# Patient Record
Sex: Male | Born: 1990 | Race: Black or African American | Hispanic: No | Marital: Married | Smoking: Former smoker
Health system: Southern US, Community
[De-identification: ages and names within clinical notes are randomized; demographics above are authoritative.]

## PROBLEM LIST (undated history)

## (undated) DIAGNOSIS — J45909 Unspecified asthma, uncomplicated: Secondary | ICD-10-CM

---

## 2013-05-09 ENCOUNTER — Emergency Department (HOSPITAL_COMMUNITY): Payer: BC Managed Care – PPO

## 2013-05-09 ENCOUNTER — Emergency Department (HOSPITAL_COMMUNITY)
Admission: EM | Admit: 2013-05-09 | Discharge: 2013-05-09 | Disposition: A | Discharge status: Home / Self Care | Payer: BC Managed Care – PPO | Attending: Emergency Medicine | Admitting: Emergency Medicine

## 2013-05-09 ENCOUNTER — Encounter (HOSPITAL_COMMUNITY): Payer: Self-pay | Admitting: Adult Health

## 2013-05-09 DIAGNOSIS — F17200 Nicotine dependence, unspecified, uncomplicated: Secondary | ICD-10-CM | POA: Insufficient documentation

## 2013-05-09 DIAGNOSIS — J45901 Unspecified asthma with (acute) exacerbation: Secondary | ICD-10-CM | POA: Insufficient documentation

## 2013-05-09 DIAGNOSIS — M549 Dorsalgia, unspecified: Secondary | ICD-10-CM | POA: Insufficient documentation

## 2013-05-09 HISTORY — DX: Unspecified asthma, uncomplicated: J45.909

## 2013-05-09 MED ORDER — ALBUTEROL SULFATE HFA 108 (90 BASE) MCG/ACT IN AERS: 1.0000 | INHALATION_SPRAY | Freq: Four times a day (QID) | RESPIRATORY_TRACT | Status: DC | PRN

## 2013-05-09 MED ORDER — PREDNISONE 20 MG PO TABS: 40.0000 mg | ORAL_TABLET | Freq: Every day | ORAL | Status: DC

## 2013-05-09 MED ORDER — IPRATROPIUM BROMIDE 0.02 % IN SOLN
0.5000 mg | RESPIRATORY_TRACT | Status: DC
  Administered 2013-05-09: 0.5 mg via RESPIRATORY_TRACT

## 2013-05-09 MED ORDER — ALBUTEROL SULFATE (5 MG/ML) 0.5% IN NEBU
2.5000 mg | INHALATION_SOLUTION | RESPIRATORY_TRACT | Status: DC
  Administered 2013-05-09: 5 mg via RESPIRATORY_TRACT

## 2013-05-09 MED ORDER — ALBUTEROL SULFATE (5 MG/ML) 0.5% IN NEBU
INHALATION_SOLUTION | RESPIRATORY_TRACT | Status: AC
  Administered 2013-05-09: 5 mg via RESPIRATORY_TRACT
  Filled 2013-05-09: qty 1

## 2013-05-09 MED ORDER — IPRATROPIUM BROMIDE 0.02 % IN SOLN
RESPIRATORY_TRACT | Status: AC
  Filled 2013-05-09: qty 2.5

## 2013-05-09 MED ORDER — ALBUTEROL SULFATE HFA 108 (90 BASE) MCG/ACT IN AERS
2.0000 | INHALATION_SPRAY | Freq: Once | RESPIRATORY_TRACT | Status: AC
  Administered 2013-05-09: 2 via RESPIRATORY_TRACT
  Filled 2013-05-09: qty 6.7

## 2013-05-09 NOTE — ED Provider Notes (Signed)
CSN: [id number]     Arrival date & time 05/09/13  2042 History  This chart was scribed for non-physician practitioner Jaynie Crumble, PA-C working with Flint Melter, MD by Valera Castle, ED scribe. This patient was seen in room TR08C/TR08C and the patient's care was started at 9:00 PM.    Chief Complaint  Patient presents with  . Asthma   The history is provided by the patient. No language interpreter was used.   HPI Comments: Adam Wallace is a 22 y.o. male with a h/o asthma who presents to the Emergency Department complaining of asthma exacerbation onset last night. He experienced gradually worsening SOB onset last night, and states that he woke up with congestion this morning. He also c/o of associated wheezing onset this morning. He states associated pain in his back/left ribs. He reports having a cough early in the week, but that it has recently subsided. He states he is out of asthma medication. He reports that he cannot remember the last asthma incident. He states he smokes 2-3 times a week, but denies drinking EtOH. He denies having a fever.    Past Medical History  Diagnosis Date  . Asthma    History reviewed. No pertinent past surgical history. History reviewed. No pertinent family history. History  Substance Use Topics  . Smoking status: Current Every Day Smoker    Types: Cigars  . Smokeless tobacco: Not on file  . Alcohol Use: No    Review of Systems  Constitutional: Negative for fever.  HENT: Positive for congestion.   Respiratory: Positive for cough, shortness of breath and wheezing.   Musculoskeletal: Positive for back pain.  All other systems reviewed and are negative.    Allergies  Review of patient's allergies indicates no known allergies.  Home Medications  No current outpatient prescriptions on file.  Triage Vitals: BP 146/82  Pulse 100  Temp(Src) 98.1 F (36.7 C)  Resp 18  SpO2 99%  Physical Exam  Nursing note and vitals  reviewed. Constitutional: He is oriented to person, place, and time. He appears well-developed and well-nourished.  HENT:  Head: Normocephalic and atraumatic.  Eyes: EOM are normal.  Neck: Normal range of motion. Neck supple.  Cardiovascular: Normal rate, regular rhythm and normal heart sounds.   Pulmonary/Chest: Effort normal. He has wheezes.  Inspiratory and expiratory wheezing in all lung fields.   Musculoskeletal: Normal range of motion.  Neurological: He is alert and oriented to person, place, and time.  Skin: Skin is warm and dry.  Psychiatric: He has a normal mood and affect. His behavior is normal.    ED Course  Procedures (including critical care time)  DIAGNOSTIC STUDIES: Oxygen Saturation is 99% on room air, normal by my interpretation.    COORDINATION OF CARE: 9:31 PM-Discussed treatment plan which includes CXR with pt at bedside and pt agreed to plan.   9:32 PM Initial wheezing has resolved after pt received a breathing treatment.   Medications  ipratropium (ATROVENT) nebulizer solution 0.5 mg ( Nebulization Not Given 05/09/13 2140)    And  albuterol (PROVENTIL) (5 MG/ML) 0.5% nebulizer solution 2.5 mg (5 mg Nebulization Given 05/09/13 2057)     Labs Review Labs Reviewed - No data to display Imaging Review Dg Chest 2 View  05/09/2013   *RADIOLOGY REPORT*  Clinical Data: Asthma  CHEST - 2 VIEW  Comparison: None.  Findings: Cardiac and mediastinal silhouettes are within normal limits.  Lungs are mildly hyperinflated.  There is mild central peribronchial thickening,  which may be related to reactive airways disease.  No focal airspace consolidation identified.  There is no pulmonary edema or pleural effusion.  No pneumothorax.  No acute osseous abnormality.  IMPRESSION: Mild hyperinflation with central peribronchial thickening, which may be related to asthma.  No focal infiltrates identified.   Original Report Authenticated By: Rise Mu, M.D.    MDM   1.  Asthma exacerbation      Patient with asthma exacerbation from home. He reports also pain in the left lower ribs. Patient ran out of his albuterol at home. On initial presentation his vital signs are normal however inspiratory and expiratory wheezes heard in all lung fields. Patient was giving albuterol and Atrovent nebulizer treatments which helped his breathing. Patient was reassessed and he is no longer wheezing moving air normally. Patient states he feels much better with one nap and does not want anymore treatments. I did get a chest x-ray due to his pain, shortness of breath to rule out pneumothorax versus pneumonia. His chest x-ray is negative. Patient will be discharged home with inhaler and prednisone short course for inflammation. Patient is to follow up with his primary care Dr.  Ceasar Mons Vitals:   05/09/13 2050  BP: 146/82  Pulse: 100  Temp: 98.1 F (36.7 C)  Resp: 18  SpO2: 99%   I personally performed the services described in this documentation, which was scribed in my presence. The recorded information has been reviewed and is accurate.    Lottie Mussel, PA-C 05/09/13 2345

## 2013-05-09 NOTE — ED Notes (Signed)
Presents with bilateral inspiratory and expiratory wheezes, able to speak in full sentences, SAts 99% RA. Began last night. Out of asthma medications.

## 2013-05-09 NOTE — ED Notes (Signed)
Provider in room with pt.

## 2013-05-10 NOTE — ED Provider Notes (Signed)
Medical screening examination/treatment/procedure(s) were performed by non-physician practitioner and as supervising physician I was immediately available for consultation/collaboration.  Reena Borromeo L Talitha Dicarlo, MD 05/10/13 0036 

## 2014-06-09 ENCOUNTER — Emergency Department (HOSPITAL_COMMUNITY)
Admission: EM | Admit: 2014-06-09 | Discharge: 2014-06-09 | Disposition: A | Discharge status: Home / Self Care | Payer: BC Managed Care – PPO | Attending: Emergency Medicine | Admitting: Emergency Medicine

## 2014-06-09 ENCOUNTER — Encounter (HOSPITAL_COMMUNITY): Payer: Self-pay | Admitting: Emergency Medicine

## 2014-06-09 DIAGNOSIS — J45901 Unspecified asthma with (acute) exacerbation: Secondary | ICD-10-CM | POA: Insufficient documentation

## 2014-06-09 DIAGNOSIS — Z79899 Other long term (current) drug therapy: Secondary | ICD-10-CM | POA: Diagnosis not present

## 2014-06-09 DIAGNOSIS — Z720 Tobacco use: Secondary | ICD-10-CM | POA: Insufficient documentation

## 2014-06-09 DIAGNOSIS — R0602 Shortness of breath: Secondary | ICD-10-CM | POA: Diagnosis present

## 2014-06-09 MED ORDER — ALBUTEROL SULFATE HFA 108 (90 BASE) MCG/ACT IN AERS: 1.0000 | INHALATION_SPRAY | Freq: Four times a day (QID) | RESPIRATORY_TRACT | Status: DC | PRN

## 2014-06-09 MED ORDER — IPRATROPIUM BROMIDE 0.02 % IN SOLN
0.5000 mg | Freq: Once | RESPIRATORY_TRACT | Status: AC
  Administered 2014-06-09: 0.5 mg via RESPIRATORY_TRACT
  Filled 2014-06-09: qty 2.5

## 2014-06-09 MED ORDER — ALBUTEROL SULFATE (2.5 MG/3ML) 0.083% IN NEBU
2.5000 mg | INHALATION_SOLUTION | Freq: Once | RESPIRATORY_TRACT | Status: AC
  Administered 2014-06-09: 2.5 mg via RESPIRATORY_TRACT
  Filled 2014-06-09: qty 3

## 2014-06-09 NOTE — ED Notes (Signed)
MD at bedside. 

## 2014-06-09 NOTE — Discharge Instructions (Signed)

## 2014-06-09 NOTE — ED Notes (Addendum)
Pt c/o SOB and mid chest ain x 1 day. Denies n/v or dizziness. Pt has hx of asthma.

## 2014-06-09 NOTE — ED Provider Notes (Signed)
CSN: [id number]     Arrival date & time 06/09/14  0709 History   First MD Initiated Contact with Patient 06/09/14 0719     Chief Complaint  Patient presents with  . Shortness of Breath  . Chest Pain     (Consider location/radiation/quality/duration/timing/severity/associated sxs/prior Treatment) Patient is a 23 y.o. male presenting with shortness of breath and chest pain. The history is provided by the patient.  Shortness of Breath Associated symptoms: chest pain   Associated symptoms: no abdominal pain and no fever   Chest Pain Associated symptoms: shortness of breath   Associated symptoms: no abdominal pain, no back pain, no fever and no numbness    patient has had cough with some shortness of breath and chest tightness since yesterday. Has a history of asthma and is out of his inhaler. The pain is dull. He's had some mild sputum production. He states is clear. No lightheadedness dizziness. No fevers. No lightheadedness dizziness. No sick contacts. His asthma is overall well controlled. No previous admissions.  Past Medical History  Diagnosis Date  . Asthma    History reviewed. No pertinent past surgical history. No family history on file. History  Substance Use Topics  . Smoking status: Current Every Day Smoker    Types: Cigars  . Smokeless tobacco: Not on file  . Alcohol Use: No    Review of Systems  Constitutional: Negative for fever and chills.  Respiratory: Positive for shortness of breath.   Cardiovascular: Positive for chest pain.  Gastrointestinal: Negative for abdominal pain.  Genitourinary: Negative for flank pain.  Musculoskeletal: Negative for arthralgias, back pain and myalgias.  Skin: Negative for wound.  Neurological: Negative for numbness.  Hematological: Negative for adenopathy.      Allergies  Review of patient's allergies indicates no known allergies.  Home Medications   Prior to Admission medications   Medication Sig Start Date End Date  Taking? Authorizing Provider  albuterol (PROVENTIL HFA;VENTOLIN HFA) 108 (90 BASE) MCG/ACT inhaler Inhale 1-2 puffs into the lungs every 6 (six) hours as needed for wheezing. 05/09/13  Yes Tatyana A Kirichenko, PA-C   BP 121/75  Pulse 86  Temp(Src) 98.4 F (36.9 C) (Oral)  Resp 21  SpO2 97% Physical Exam  Nursing note and vitals reviewed. Constitutional: He is oriented to person, place, and time. He appears well-developed and well-nourished.  HENT:  Head: Normocephalic and atraumatic.  Cardiovascular: Normal rate, regular rhythm and normal heart sounds.   No murmur heard. Pulmonary/Chest: Effort normal. He has wheezes.  Mild diffuse harsh wheezes. No respiratory distress.  Abdominal: Soft. He exhibits no distension. There is no tenderness.  Musculoskeletal: Normal range of motion. He exhibits no edema.  Neurological: He is alert and oriented to person, place, and time. No cranial nerve deficit.  Skin: Skin is warm and dry.  Psychiatric: He has a normal mood and affect.    ED Course  Procedures (including critical care time) Labs Review Labs Reviewed - No data to display  Imaging Review No results found.   EKG Interpretation None      MDM   Final diagnoses:  None    Patient with shortness of breath. Cough. Some chest tightness. Likely asthma exacerbation likely due to viral etiology. Patient feels much better after breathing treatment will be discharged home with an inhaler    Harrold DonathNathan R. Rubin PayorPickering, MD 06/09/14 [phone]

## 2015-06-03 ENCOUNTER — Encounter (HOSPITAL_COMMUNITY): Payer: Self-pay

## 2015-06-03 ENCOUNTER — Emergency Department (HOSPITAL_COMMUNITY)
Admission: EM | Admit: 2015-06-03 | Discharge: 2015-06-03 | Disposition: A | Discharge status: Home / Self Care | Payer: BLUE CROSS/BLUE SHIELD | Attending: Emergency Medicine | Admitting: Emergency Medicine

## 2015-06-03 DIAGNOSIS — Z0000 Encounter for general adult medical examination without abnormal findings: Secondary | ICD-10-CM | POA: Diagnosis not present

## 2015-06-03 DIAGNOSIS — Z79899 Other long term (current) drug therapy: Secondary | ICD-10-CM | POA: Diagnosis not present

## 2015-06-03 DIAGNOSIS — Z720 Tobacco use: Secondary | ICD-10-CM | POA: Insufficient documentation

## 2015-06-03 DIAGNOSIS — J45909 Unspecified asthma, uncomplicated: Secondary | ICD-10-CM | POA: Diagnosis not present

## 2015-06-03 DIAGNOSIS — H578 Other specified disorders of eye and adnexa: Secondary | ICD-10-CM | POA: Diagnosis present

## 2015-06-03 NOTE — ED Notes (Signed)
Pt reports he was at work today, dropped a tray and broke a glass.  States felt glass went to his eyes.  Denies any visual changes at this time.

## 2015-06-03 NOTE — Discharge Instructions (Signed)
NO GLASS OR OTHER FOREIGN BODY IN THE RIGHT EYE. RETURN HERE WITH ANY EYE PAIN, DRAINAGE OR NEW SYMPTOMS OF CONCERN.

## 2015-06-03 NOTE — ED Provider Notes (Signed)
CSN: [id number]     Arrival date & time 06/03/15  2015 History  By signing my name below, I, Marica Otter, attest that this documentation has been prepared under the direction and in the presence of Elpidio Anis, PA-C. Electronically Signed: Marica Otter, ED Scribe. 06/03/2015. 11:24 PM.  Chief Complaint  Patient presents with  . Foreign Body in Eye   The history is provided by the patient. No language interpreter was used.   PCP: Pcp Not In System HPI Comments: Adam Wallace is a 24 y.o. male, with PMHx noted below including contact lens use, who presents to the Emergency Department complaining of possible glass in his eyes onset this evening around 7:15PM. Pt reports he dropped and broke some dishes at work today and pt's work wanted to get checked to ensure no glass was in his eyes. Pt reports flushing his eyes out immediately following the accident. Pt states he does not feel like he has any glass in his eye. Pt denies visual disturbance, visual pain, eye drainage, eye redness or any other Sx. Pt reports that he feels at baseline during exam.   Past Medical History  Diagnosis Date  . Asthma    History reviewed. No pertinent past surgical history. History reviewed. No pertinent family history. Social History  Substance Use Topics  . Smoking status: Current Every Day Smoker    Types: Cigars  . Smokeless tobacco: None  . Alcohol Use: No    Review of Systems  HENT: Negative for facial swelling.   Eyes: Negative for pain, discharge, redness and visual disturbance.  Neurological: Negative for headaches.   Allergies  Review of patient's allergies indicates no known allergies.  Home Medications   Prior to Admission medications   Medication Sig Start Date End Date Taking? Authorizing Provider  albuterol (PROVENTIL HFA;VENTOLIN HFA) 108 (90 BASE) MCG/ACT inhaler Inhale 1-2 puffs into the lungs every 6 (six) hours as needed for wheezing. 06/09/14  Yes Benjiman Core, MD   Triage  Vitals: BP 128/78 mmHg  Pulse 81  Temp(Src) 98.4 F (36.9 C) (Oral)  Resp 20  SpO2 100% Physical Exam  Constitutional: He is oriented to person, place, and time. He appears well-developed and well-nourished.  HENT:  Head: Normocephalic.  Eyes: EOM are normal.  Sclera clear, no subconjunctival hemorrhage, conjunctiva or otherwise normal in appearance. No foreign body visualized, no hyphema, lids without swelling or redness. Full pain free EOM.   Neck: Normal range of motion.  Pulmonary/Chest: Effort normal.  Abdominal: He exhibits no distension.  Musculoskeletal: Normal range of motion.  Neurological: He is alert and oriented to person, place, and time.  Psychiatric: He has a normal mood and affect.  Nursing note and vitals reviewed.   ED Course  Procedures (including critical care time) DIAGNOSTIC STUDIES: Oxygen Saturation is 100% on ra, nl by my interpretation.    COORDINATION OF CARE: 11:14 PM: Discussed treatment plan with pt at bedside; patient verbalizes understanding and agrees with treatment plan.  MDM   Final diagnoses:  None    1. Normal physical exam  No pain, redness or other abnormality on eye exam in asymptomatic patient urged to come for evaluation by employer. Stable for discharge.   I personally performed the services described in this documentation, which was scribed in my presence. The recorded information has been reviewed and is accurate.     Elpidio Anis, PA-C 06/04/15 4098  Lyndal Pulley, MD 06/08/15 [phone]

## 2015-06-03 NOTE — ED Notes (Signed)
Pt dropped some dishes at work and they wanted him checked to see if he had glass in his eyes, pt doesn't feel like he has anything in his eyes

## 2015-09-12 ENCOUNTER — Emergency Department (HOSPITAL_COMMUNITY): Payer: BLUE CROSS/BLUE SHIELD

## 2015-09-12 ENCOUNTER — Encounter (HOSPITAL_COMMUNITY): Payer: Self-pay

## 2015-09-12 ENCOUNTER — Emergency Department (HOSPITAL_COMMUNITY)
Admission: EM | Admit: 2015-09-12 | Discharge: 2015-09-13 | Disposition: A | Discharge status: Home / Self Care | Payer: BLUE CROSS/BLUE SHIELD | Attending: Emergency Medicine | Admitting: Emergency Medicine

## 2015-09-12 DIAGNOSIS — Z87891 Personal history of nicotine dependence: Secondary | ICD-10-CM | POA: Insufficient documentation

## 2015-09-12 DIAGNOSIS — Y998 Other external cause status: Secondary | ICD-10-CM | POA: Insufficient documentation

## 2015-09-12 DIAGNOSIS — Y9389 Activity, other specified: Secondary | ICD-10-CM | POA: Insufficient documentation

## 2015-09-12 DIAGNOSIS — Z79899 Other long term (current) drug therapy: Secondary | ICD-10-CM | POA: Insufficient documentation

## 2015-09-12 DIAGNOSIS — Y9289 Other specified places as the place of occurrence of the external cause: Secondary | ICD-10-CM | POA: Insufficient documentation

## 2015-09-12 DIAGNOSIS — S62316A Displaced fracture of base of fifth metacarpal bone, right hand, initial encounter for closed fracture: Secondary | ICD-10-CM | POA: Diagnosis not present

## 2015-09-12 DIAGNOSIS — S6991XA Unspecified injury of right wrist, hand and finger(s), initial encounter: Secondary | ICD-10-CM | POA: Diagnosis present

## 2015-09-12 DIAGNOSIS — J45909 Unspecified asthma, uncomplicated: Secondary | ICD-10-CM | POA: Diagnosis not present

## 2015-09-12 DIAGNOSIS — X58XXXA Exposure to other specified factors, initial encounter: Secondary | ICD-10-CM | POA: Insufficient documentation

## 2015-09-12 DIAGNOSIS — S62309A Unspecified fracture of unspecified metacarpal bone, initial encounter for closed fracture: Secondary | ICD-10-CM

## 2015-09-12 NOTE — ED Notes (Addendum)
Patient states having right wrist and right hand pain.  Some numbness in right fingers.  Patient states that hit something (does not remember what he hit) around 1800 today, fell asleep and woke up at 2100 in pain.  Patient rates pain 10/10.  Breathing even and unlabored, NAD at this time.

## 2015-09-13 MED ORDER — NAPROXEN 500 MG PO TABS
500.0000 mg | ORAL_TABLET | Freq: Once | ORAL | Status: AC
  Administered 2015-09-13: 500 mg via ORAL
  Filled 2015-09-13: qty 1

## 2015-09-13 MED ORDER — OXYCODONE-ACETAMINOPHEN 5-325 MG PO TABS
2.0000 | ORAL_TABLET | Freq: Once | ORAL | Status: AC
  Administered 2015-09-13: 2 via ORAL
  Filled 2015-09-13: qty 2

## 2015-09-13 MED ORDER — OXYCODONE-ACETAMINOPHEN 5-325 MG PO TABS: 1.0000 | ORAL_TABLET | Freq: Four times a day (QID) | ORAL | Status: DC | PRN

## 2015-09-13 MED ORDER — NAPROXEN 500 MG PO TABS: 500.0000 mg | ORAL_TABLET | Freq: Two times a day (BID) | ORAL | Status: DC

## 2015-09-13 NOTE — Discharge Instructions (Signed)
Wear your splint at all times until you see an orthopedic hand surgeon for follow up. Ice over your splint 3-4 times per day for 15-20 minutes each time. Take Naproxen for pain. You may take Percocet as needed for pain control. Keep your arm elevated to prevent swelling. Return, as needed, if symptoms worsen.  Metacarpal Fracture A metacarpal fracture is a break (fracture) of a bone in the hand. Metacarpals are the bones that extend from your knuckles to your wrist. In each hand, you have five metacarpal bones that connect your fingers and your thumb to your wrist. Some hand fractures have bone pieces that are close together and stable (simple). These fractures may be treated with only a splint or cast. Hand fractures that have many pieces of broken bone (comminuted), unstable bone pieces (displaced), or a bone that breaks through the skin (compound) usually require surgery. CAUSES This injury may be caused by:  A fall.  A hard, direct hit to your hand.  An injury that squeezes your knuckle, stretches your finger out of place, or crushes your hand. RISK FACTORS This injury is more likely to occur if:  You play contact sports.  You have certain bone diseases. SYMPTOMS  Symptoms of this type of fracture develop soon after the injury. Symptoms may include:  Swelling.  Pain.  Stiffness.  Increased pain with movement.  Bruising.  Inability to move a finger.  A shortened finger.  A finger knuckle that looks sunken in.  Unusual appearance of the hand or finger (deformity). DIAGNOSIS  This injury may be diagnosed based on your signs and symptoms, especially if you had a recent hand injury. Your health care provider will perform a physical exam. He or she may also order X-rays to confirm the diagnosis.  TREATMENT  Treatment for this injury depends on the type of fracture you have and how severe it is. Possible treatments include:  Non-reduction. This can be done if the bone does  not need to be moved back into place. The fracture can be casted or splinted as it is.   Closed reduction. If your bone is stable and can be moved back into place, you may only need to wear a cast or splint or have buddy taping.  Closed reduction with internal fixation (CRIF). This is the most common treatment. You may have this procedure if your bone can be moved back into place but needs more support. Wires, pins, or screws may be inserted through your skin to stabilize the fracture.  Open reduction with internal fixation (ORIF). This may be needed if your fracture is severe and unstable. It involves surgery to move your bone back into the right position. Screws, wires, or plates are used to stabilize the fracture. After all procedures, you may need to wear a cast or a splint for several weeks. You will also need to have follow-up X-rays to make sure that the bone is healing well and staying in position. After you no longer need your cast or splint, you may need physical therapy. This will help you to regain full movement and strength in your hand.  HOME CARE INSTRUCTIONS  If You Have a Cast:  Do not stick anything inside the cast to scratch your skin. Doing that increases your risk of infection.  Check the skin around the cast every day. Report any concerns to your health care provider. You may put lotion on dry skin around the edges of the cast. Do not apply lotion to the  skin underneath the cast. If You Have a Splint:  Wear it as directed by your health care provider. Remove it only as directed by your health care provider.  Loosen the splint if your fingers become numb and tingle, or if they turn cold and blue. Bathing  Cover the cast or splint with a watertight plastic bag to protect it from water while you take a bath or a shower. Do not let the cast or splint get wet. Managing Pain, Stiffness, and Swelling  If directed, apply ice to the injured area (if you have a splint, not a  cast):  Put ice in a plastic bag.  Place a towel between your skin and the bag.  Leave the ice on for 20 minutes, 2-3 times a day.  Move your fingers often to avoid stiffness and to lessen swelling.  Raise the injured area above the level of your heart while you are sitting or lying down. Driving  Do not drive or operate heavy machinery while taking pain medicine.  Do not drive while wearing a cast or splint on a hand that you use for driving. Activity  Return to your normal activities as directed by your health care provider. Ask your health care provider what activities are safe for you. General Instructions  Do not put pressure on any part of the cast or splint until it is fully hardened. This may take several hours.  Keep the cast or splint clean and dry.  Do not use any tobacco products, including cigarettes, chewing tobacco, or electronic cigarettes. Tobacco can delay bone healing. If you need help quitting, ask your health care provider.  Take medicines only as directed by your health care provider.  Keep all follow-up visits as directed by your health care provider. This is important. SEEK MEDICAL CARE IF:   Your pain is getting worse.  You have redness, swelling, or pain in the injured area.   You have fluid, blood, or pus coming from under your cast or splint.   You notice a bad smell coming from under your cast or splint.   You have a fever.  SEEK IMMEDIATE MEDICAL CARE IF:   You develop a rash.   You have trouble breathing.   Your skin or nails on your injured hand turn blue or gray even after you loosen your splint.  Your injured hand feels cold or becomes numb even after you loosen your splint.   You develop severe pain under the cast or in your hand.   This information is not intended to replace advice given to you by your health care provider. Make sure you discuss any questions you have with your health care provider.   Document Released:  08/22/2005 Document Revised: 05/13/2015 Document Reviewed: 06/11/2014 [employer] Interactive Patient Education [employer]
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
## 2015-12-10 ENCOUNTER — Emergency Department (HOSPITAL_COMMUNITY)
Admission: EM | Admit: 2015-12-10 | Discharge: 2015-12-10 | Disposition: A | Discharge status: Home / Self Care | Payer: BLUE CROSS/BLUE SHIELD | Attending: Emergency Medicine | Admitting: Emergency Medicine

## 2015-12-10 ENCOUNTER — Encounter (HOSPITAL_COMMUNITY): Payer: Self-pay | Admitting: *Deleted

## 2015-12-10 DIAGNOSIS — R112 Nausea with vomiting, unspecified: Secondary | ICD-10-CM | POA: Diagnosis not present

## 2015-12-10 DIAGNOSIS — Z79899 Other long term (current) drug therapy: Secondary | ICD-10-CM | POA: Insufficient documentation

## 2015-12-10 DIAGNOSIS — J45909 Unspecified asthma, uncomplicated: Secondary | ICD-10-CM | POA: Diagnosis not present

## 2015-12-10 DIAGNOSIS — R197 Diarrhea, unspecified: Secondary | ICD-10-CM

## 2015-12-10 DIAGNOSIS — Z87891 Personal history of nicotine dependence: Secondary | ICD-10-CM | POA: Diagnosis not present

## 2015-12-10 LAB — COMPREHENSIVE METABOLIC PANEL
ALK PHOS: 46 U/L (ref 38–126)
ALT: 14 U/L — ABNORMAL LOW (ref 17–63)
ANION GAP: 7 (ref 5–15)
AST: 18 U/L (ref 15–41)
Albumin: 4 g/dL (ref 3.5–5.0)
BILIRUBIN TOTAL: 0.5 mg/dL (ref 0.3–1.2)
BUN: 8 mg/dL (ref 6–20)
CALCIUM: 9 mg/dL (ref 8.9–10.3)
CO2: 26 mmol/L (ref 22–32)
Chloride: 105 mmol/L (ref 101–111)
Creatinine, Ser: 1.02 mg/dL (ref 0.61–1.24)
GFR calc non Af Amer: >60 mL/min (ref >60)
Glucose, Bld: 105 mg/dL — ABNORMAL HIGH (ref 65–99)
POTASSIUM: 3.6 mmol/L (ref 3.5–5.1)
SODIUM: 138 mmol/L (ref 135–145)
TOTAL PROTEIN: 7.1 g/dL (ref 6.5–8.1)

## 2015-12-10 LAB — CBC
HCT: 42.1 % (ref 39.0–52.0)
HEMOGLOBIN: 14.9 g/dL (ref 13.0–17.0)
MCH: 30 pg (ref 26.0–34.0)
MCHC: 35.4 g/dL (ref 30.0–36.0)
MCV: 84.9 fL (ref 78.0–100.0)
Platelets: 170 10*3/uL (ref 150–400)
RBC: 4.96 MIL/uL (ref 4.22–5.81)
RDW: 12.4 % (ref 11.5–15.5)
WBC: 4.2 10*3/uL (ref 4.0–10.5)

## 2015-12-10 LAB — URINALYSIS, ROUTINE W REFLEX MICROSCOPIC
BILIRUBIN URINE: NEGATIVE
Glucose, UA: NEGATIVE mg/dL
Hgb urine dipstick: NEGATIVE
Ketones, ur: NEGATIVE mg/dL
LEUKOCYTES UA: NEGATIVE
NITRITE: NEGATIVE
Protein, ur: NEGATIVE mg/dL
SPECIFIC GRAVITY, URINE: 1.023 (ref 1.005–1.030)
pH: 6 (ref 5.0–8.0)

## 2015-12-10 LAB — LIPASE, BLOOD: Lipase: 17 U/L (ref 11–51)

## 2015-12-10 MED ORDER — ONDANSETRON 4 MG PO TBDP
4.0000 mg | ORAL_TABLET | Freq: Once | ORAL | Status: AC | PRN
  Administered 2015-12-10: 4 mg via ORAL
  Filled 2015-12-10: qty 1

## 2015-12-10 MED ORDER — ONDANSETRON 4 MG PO TBDP: 4.0000 mg | ORAL_TABLET | Freq: Three times a day (TID) | ORAL | Status: DC | PRN

## 2015-12-10 NOTE — Discharge Instructions (Signed)
Take medications as prescribed. Return to the emergency room for worsening condition or new concerning symptoms. Follow up with your regular doctor. If you don't have a regular doctor use one of the numbers below to establish a primary care doctor. ° ° °Emergency Department Resource Guide °1) Find a Doctor and Pay Out of Pocket °Although you won't have to find out who is covered by your insurance plan, it is a good idea to ask around and get recommendations. You will then need to call the office and see if the doctor you have chosen will accept you as a new patient and what types of options they offer for patients who are self-pay. Some doctors offer discounts or will set up payment plans for their patients who do not have insurance, but you will need to ask so you aren't surprised when you get to your appointment. ° °2) Contact Your Local Health Department °Not all health departments have doctors that can see patients for sick visits, but many do, so it is worth a call to see if yours does. If you don't know where your local health department is, you can check in your phone book. The CDC also has a tool to help you locate your state's health department, and many state websites also have listings of all of their local health departments. ° °3) Find a Walk-in Clinic °If your illness is not likely to be very severe or complicated, you may want to try a walk in clinic. These are popping up all over the country in pharmacies, drugstores, and shopping centers. They're usually staffed by nurse practitioners or physician assistants that have been trained to treat common illnesses and complaints. They're usually fairly quick and inexpensive. However, if you have serious medical issues or chronic medical problems, these are probably not your best option. ° °No Primary Care Doctor: °- Call [facility] at  832-8000 - they can help you locate a primary care doctor that  accepts your insurance, provides certain services,  etc. °- Physician Referral Service- 1-800-533-3463 ° °Emergency Department Resource Guide °1) Find a Doctor and Pay Out of Pocket °Although you won't have to find out who is covered by your insurance plan, it is a good idea to ask around and get recommendations. You will then need to call the office and see if the doctor you have chosen will accept you as a new patient and what types of options they offer for patients who are self-pay. Some doctors offer discounts or will set up payment plans for their patients who do not have insurance, but you will need to ask so you aren't surprised when you get to your appointment. ° °2) Contact Your Local Health Department °Not all health departments have doctors that can see patients for sick visits, but many do, so it is worth a call to see if yours does. If you don't know where your local health department is, you can check in your phone book. The CDC also has a tool to help you locate your state's health department, and many state websites also have listings of all of their local health departments. ° °3) Find a Walk-in Clinic °If your illness is not likely to be very severe or complicated, you may want to try a walk in clinic. These are popping up all over the country in pharmacies, drugstores, and shopping centers. They're usually staffed by nurse practitioners or physician assistants that have been trained to treat common illnesses and complaints. They're usually fairly   quick and inexpensive. However, if you have serious medical issues or chronic medical problems, these are probably not your best option. ° °No Primary Care Doctor: °- Call [facility] at  832-8000 - they can help you locate a primary care doctor that  accepts your insurance, provides certain services, etc. °- Physician Referral Service- 1-800-533-3463 ° °Chronic Pain Problems: °Organization         Address  Phone   Notes  °[facility]  [phone] Patients need to be referred by  their primary care doctor.  ° °Medication Assistance: °Organization         Address  Phone   Notes  °[place] [facility] [address] °[place], [place] [address] [phone]) [phone] --Must be a resident of [place] °-- Must have NO insurance coverage whatsoever (no Medicaid/ Medicare, etc.) °-- The pt. MUST have a primary care doctor that directs their care regularly and follows them in the community °  °[facility]  (866) 331-1348   °[employer]  (888) 892-1162   ° °Agencies that provide inexpensive medical care: °Organization         Address  Phone   Notes  °[facility]  ([phone]) [phone]   °[facility]    ([phone]) [phone]   °[facility] [address] °[place], [place] [address] [phone]) [phone]   °[facility] [address], °[place] ([phone]) [phone]   °[facility]    ([phone]) [phone]   °[facility]    ([phone]) [phone]   °[facility] ° [address], [place] Phone:  ([phone]) [phone], Fax:  ([phone] Hours of Operation:  9 am - 6 pm, M-F.  Also accepts Medicaid/Medicare and self-pay.  °[facility] ° [address], [place] Phone: ([phone]) [phone], Fax: ([phone]. Hours of Operation:  8:30 am - 5:30 pm, M-F.  Also accepts Medicaid and self-pay.  °[facility] [place] [address], [place] Phone: ([phone]) [phone]   °[facility] [address], [place], [place] (336)[phone], Ext. 123 Mondays & Thursdays: 7-9 AM.  First 15 patients are seen on a first come, first serve basis. °  ° °Medicaid-accepting [place] Providers: ° °Organization         Address  Phone   Notes  °[facility] [address], [place] [phone] Also accepts self-pay patients.  °[facility] [address], [place] ° ([phone]) [phone]   °[facility]  [address], [place] [phone]) [phone]   °[facility] [address] [place] [address], [place] [phone]) [phone]   °[facility] [address], [place]  ° ([phone]) [phone] Only accepts [place] Access Medicaid patients after they have their name applied to their card.  ° °Self-Pay (no insurance) in [place]: ° °Organization         Address  Phone   Notes  °Sickle Cell Patients, [facility] [address], [place] ([phone]) [phone]   °[facility] [address], [place] ([phone]) [phone]   °[facility] ° [address], [place] ([phone]) [phone]   °[facility]/Dr. Osei-Bonsu ° [address] [place] [address], [place] or [address], [place] [phone] Phone number   for both [employer] and [place] locations is the same.  [facility] and [facility] [phone]   [facility] [address] [place] or [address] [phone]   [facility] [address], [place] [phone], phone; [phone], fax Sees patients 1st and 3rd Saturday of every month.  Must not qualify for public or private insurance (i.e. Medicaid, Medicare, [employer], Veterans' Benefits)  Household income should be no more than 200% of the poverty level The clinic cannot treat you if you are pregnant or think you are pregnant  Sexually transmitted diseases are not treated at the clinic.     Food Choices to Help Relieve Diarrhea, Adult When you have diarrhea, the foods you eat and your eating habits are very important. Choosing the right foods and drinks can help relieve diarrhea. Also, because diarrhea can last up to 7 days, you need to replace lost fluids and electrolytes (such as sodium, potassium, and chloride) in order to help prevent dehydration.  WHAT GENERAL GUIDELINES DO I NEED  TO FOLLOW?  Slowly drink 1 cup (8 oz) of fluid for each episode of diarrhea. If you are getting enough fluid, your urine will be clear or pale yellow.  Eat starchy foods. Some good choices include white rice, white toast, pasta, low-fiber cereal, baked potatoes (without the skin), saltine crackers, and bagels.  Avoid large servings of any cooked vegetables.  Limit fruit to two servings per day. A serving is  cup or 1 small piece.  Choose foods with less than 2 g of fiber per serving.  Limit fats to less than 8 tsp (38 g) per day.  Avoid fried foods.  Eat foods that have probiotics in them. Probiotics can be found in certain dairy products.  Avoid foods and beverages that may increase the speed at which food moves through the stomach and intestines (gastrointestinal tract). Things to avoid include:  High-fiber foods, such as dried fruit, raw fruits and vegetables, nuts, seeds, and whole grain foods.  Spicy foods and high-fat foods.  Foods and beverages sweetened with high-fructose corn syrup, honey, or sugar alcohols such as xylitol, sorbitol, and mannitol. WHAT FOODS ARE RECOMMENDED? Grains White rice. White, [place], or pita breads (fresh or toasted), including plain rolls, buns, or bagels. White pasta. Saltine, soda, or graham crackers. Pretzels. Low-fiber cereal. Cooked cereals made with water (such as cornmeal, farina, or cream cereals). Plain muffins. Matzo. Melba toast. Zwieback.  Vegetables Potatoes (without the skin). Strained tomato and vegetable juices. Most well-cooked and canned vegetables without seeds. Tender lettuce. Fruits Cooked or canned applesauce, apricots, cherries, fruit cocktail, grapefruit, peaches, pears, or plums. Fresh bananas, apples without skin, cherries, grapes, cantaloupe, grapefruit, peaches, oranges, or plums.  Meat and Other Protein Products Baked or boiled chicken. Eggs. Tofu. Fish. Seafood. Smooth peanut butter. Ground or well-cooked tender beef,  ham, veal, lamb, pork, or poultry.  Dairy Plain yogurt, kefir, and unsweetened liquid yogurt. Lactose-free milk, buttermilk, or soy milk. Plain hard cheese. Beverages Sport drinks. Clear broths. Diluted fruit juices (except prune). Regular, caffeine-free sodas such as ginger ale. Water. Decaffeinated teas. Oral rehydration solutions. Sugar-free beverages not sweetened with sugar alcohols. Other Bouillon, broth, or soups made from recommended foods.  The items listed above may not be a complete list of recommended foods or beverages. Contact your dietitian for more options. WHAT FOODS ARE NOT RECOMMENDED? Grains Whole grain, whole wheat, bran, or rye  breads, rolls, pastas, crackers, and cereals. Wild or brown rice. Cereals that contain more than 2 g of fiber per serving. Corn tortillas or taco shells. Cooked or dry oatmeal. Granola. Popcorn. Vegetables Raw vegetables. Cabbage, broccoli, Brussels sprouts, artichokes, baked beans, beet greens, corn, kale, legumes, peas, sweet potatoes, and yams. Potato skins. Cooked spinach and cabbage. Fruits Dried fruit, including raisins and dates. Raw fruits. Stewed or dried prunes. Fresh apples with skin, apricots, mangoes, pears, raspberries, and strawberries.  Meat and Other Protein Products Chunky peanut butter. Nuts and seeds. Beans and lentils. Tomasa BlaseBacon.  Dairy High-fat cheeses. Milk, chocolate milk, and beverages made with milk, such as milk shakes. Cream. Ice cream. Sweets and Desserts Sweet rolls, doughnuts, and sweet breads. Pancakes and waffles. Fats and Oils Butter. Cream sauces. Margarine. Salad oils. Plain salad dressings. Olives. Avocados.  Beverages Caffeinated beverages (such as coffee, tea, soda, or energy drinks). Alcoholic beverages. Fruit juices with pulp. Prune juice. Soft drinks sweetened with high-fructose corn syrup or sugar alcohols. Other Coconut. Hot sauce. Chili powder. Mayonnaise. Gravy. Cream-based or milk-based soups.  The  items listed above may not be a complete list of foods and beverages to avoid. Contact your dietitian for more information. WHAT SHOULD I DO IF I BECOME DEHYDRATED? Diarrhea can sometimes lead to dehydration. Signs of dehydration include dark urine and dry mouth and skin. If you think you are dehydrated, you should rehydrate with an oral rehydration solution. These solutions can be purchased at pharmacies, retail stores, or online.  Drink -1 cup (120-240 mL) of oral rehydration solution each time you have an episode of diarrhea. If drinking this amount makes your diarrhea worse, try drinking smaller amounts more often. For example, drink 1-3 tsp (5-15 mL) every 5-10 minutes.  A general rule for staying hydrated is to drink 1-2 L of fluid per day. Talk to your health care provider about the specific amount you should be drinking each day. Drink enough fluids to keep your urine clear or pale yellow.   This information is not intended to replace advice given to you by your health care provider. Make sure you discuss any questions you have with your health care provider.   Document Released: 11/12/2003 Document Revised: 09/12/2014 Document Reviewed: 07/15/2013 Elsevier Interactive Patient Education [employer]2016 [employer] Inc.

## 2015-12-10 NOTE — ED Notes (Signed)
Per pt report: pt presents with vomiting since Monday.  Pt reports that every morning and having nausea, vomiting, and feeling weak.  As the day progresses, pt reports the symptoms fade.  Pt has a gatorade in triage that he reports being able to tolerate without emesis. Pt reports having diarrhea.

## 2015-12-10 NOTE — ED Provider Notes (Signed)
CSN: [id number]     Arrival date & time 12/10/15  1701 History   First MD Initiated Contact with Patient 12/10/15 2026     Chief Complaint  Patient presents with  . Emesis    HPI   Adam Wallace is an 25 y.o. male with history of asthma who presents to the ED for evaluation of n/v/d. He states that for the past four days he has had intermittent nausea and vomiting. States it is worse in the morning. He reports 2-3 episodes of NBNB emesis daily. He endorses associated watery diarrhea. Endorses chills but is unsure if he has had a fever. Denies abdominal pain. Denies urinary symptoms. Denies URI symptoms. States he is not currently nauseated. He states he is here because he needs a note that he can return to work.  Past Medical History  Diagnosis Date  . Asthma    No past surgical history on file. No family history on file. Social History  Substance Use Topics  . Smoking status: Former Smoker    Quit date: 07/13/2015  . Smokeless tobacco: None  . Alcohol Use: 0.6 oz/week    1 Cans of beer per week     Comment: every day    Review of Systems  All other systems reviewed and are negative.     Allergies  Review of patient's allergies indicates no known allergies.  Home Medications   Prior to Admission medications   Medication Sig Start Date End Date Taking? Authorizing Provider  acetaminophen (TYLENOL) 325 MG tablet Take 650 mg by mouth every 6 (six) hours as needed for headache.   Yes Historical Provider, MD  albuterol (PROVENTIL HFA;VENTOLIN HFA) 108 (90 BASE) MCG/ACT inhaler Inhale 1-2 puffs into the lungs every 6 (six) hours as needed for wheezing. 06/09/14  Yes Benjiman CoreNathan Pickering, MD  bismuth subsalicylate (PEPTO BISMOL) 262 MG/15ML suspension Take 30 mLs by mouth every 6 (six) hours as needed for indigestion.   Yes Historical Provider, MD  naproxen (NAPROSYN) 500 MG tablet Take 1 tablet (500 mg total) by mouth 2 (two) times daily. Patient not taking: Reported on 12/10/2015  09/13/15   Antony MaduraKelly Humes, PA-C  oxyCODONE-acetaminophen (PERCOCET/ROXICET) 5-325 MG tablet Take 1-2 tablets by mouth every 6 (six) hours as needed for severe pain. Patient not taking: Reported on 12/10/2015 09/13/15   Antony MaduraKelly Humes, PA-C   BP 123/79 mmHg  Pulse 64  Temp(Src) 97.8 F (36.6 C) (Oral)  Resp 18  SpO2 100% Physical Exam  Constitutional: He is oriented to person, place, and time.  HENT:  Right Ear: External ear normal.  Left Ear: External ear normal.  Nose: Nose normal.  Mouth/Throat: Oropharynx is clear and moist. No oropharyngeal exudate.  Eyes: Conjunctivae and EOM are normal. Pupils are equal, round, and reactive to light.  Neck: Normal range of motion. Neck supple.  Cardiovascular: Normal rate, regular rhythm, normal heart sounds and intact distal pulses.   Pulmonary/Chest: Effort normal and breath sounds normal. No respiratory distress. He has no wheezes. He exhibits no tenderness.  Abdominal: Soft. Bowel sounds are normal. He exhibits no distension. There is no tenderness. There is no rebound and no guarding.  Musculoskeletal: He exhibits no edema.  Neurological: He is alert and oriented to person, place, and time. No cranial nerve deficit.  Skin: Skin is warm and dry.  Psychiatric: He has a normal mood and affect.  Nursing note and vitals reviewed.   ED Course  Procedures (including critical care time) Labs Review Labs Reviewed  COMPREHENSIVE METABOLIC PANEL - Abnormal; Notable for the following:    Glucose, Bld 105 (*)    ALT 14 (*)    All other components within normal limits  LIPASE, BLOOD  CBC  URINALYSIS, ROUTINE W REFLEX MICROSCOPIC (NOT AT [facility])    Imaging Review No results found. I have personally reviewed and evaluated these images and lab results as part of my medical decision-making.   EKG Interpretation None      MDM   Final diagnoses:  Non-intractable vomiting with nausea, vomiting of unspecified type  Diarrhea, unspecified type     Suspect viral etiology. Labs unremarkable. Pt is nontoxic appearing with nonfocal exam.  He is tolerating PO. He has no fever or other abnormal vital signs. He is stable for discharge with outpatient f/u. Rx for zofran given. Resource guide given. ER return precautions given.    Carlene Coria, PA-C 12/11/15 0004  Alvira Monday, MD 12/14/15 [phone]

## 2016-03-28 ENCOUNTER — Ambulatory Visit (INDEPENDENT_AMBULATORY_CARE_PROVIDER_SITE_OTHER): Payer: BLUE CROSS/BLUE SHIELD | Admitting: Physician Assistant

## 2016-03-28 VITALS — BP 118/80 | HR 79 | Temp 98.5°F | Resp 18 | Ht 75.0 in | Wt 164.0 lb

## 2016-03-28 DIAGNOSIS — Z113 Encounter for screening for infections with a predominantly sexual mode of transmission: Secondary | ICD-10-CM

## 2016-03-28 DIAGNOSIS — Z202 Contact with and (suspected) exposure to infections with a predominantly sexual mode of transmission: Secondary | ICD-10-CM | POA: Diagnosis not present

## 2016-03-28 MED ORDER — AZITHROMYCIN 250 MG PO TABS: ORAL_TABLET | ORAL | 0 refills | Status: DC

## 2016-03-28 MED ORDER — CEFTRIAXONE SODIUM 1 G IJ SOLR
250.0000 mg | Freq: Once | INTRAMUSCULAR | Status: AC
  Administered 2016-03-28: 250 mg via INTRAMUSCULAR

## 2016-03-28 NOTE — Progress Notes (Signed)
   03/28/2016 5:03 PM   DOB: 1990/09/08 / MRN: [id number]  SUBJECTIVE:  Adam Wallace is a 25 y.o. male presenting for exposure to chlamydia.  Reports his girl friend and now wife was diagnosed with the illness 2 months ago and he is and was her only partner.  He has not been seen for this problem.  He denies testicular pain, dysuria, urgency and frequency.  States "I feel fine."  He has No Known Allergies.   He  has a past medical history of Asthma.    He  reports that he quit smoking about 8 months ago. He does not have any smokeless tobacco history on file. He reports that he drinks about 0.6 oz of alcohol per week . He reports that he does not use drugs. He  has no sexual activity history on file. The patient  has no past surgical history on file.  His family history is not on file.  Review of Systems  Constitutional: Negative for chills and fever.  Gastrointestinal: Negative for nausea.  Genitourinary: Negative.   Skin: Negative for rash.  Neurological: Negative for dizziness.    Problem list and medications reviewed and updated by myself where necessary, and exist elsewhere in the encounter.   OBJECTIVE:  BP 118/80   Pulse 79   Temp 98.5 F (36.9 C) (Oral)   Resp 18   Ht 6\' 3"  (1.905 m)   Wt 164 lb (74.4 kg)   SpO2 99%   BMI 20.50 kg/m   Physical Exam  Constitutional: He is oriented to person, place, and time. He appears well-developed. He does not appear ill.  Eyes: Conjunctivae and EOM are normal. Pupils are equal, round, and reactive to light.  Cardiovascular: Normal rate.   Pulmonary/Chest: Effort normal.  Abdominal: He exhibits no distension.  Genitourinary: Penis normal. Right testis shows no mass and no tenderness. Left testis shows no mass and no tenderness.  Musculoskeletal: Normal range of motion.  Lymphadenopathy:       Right: No inguinal adenopathy present.       Left: No inguinal adenopathy present.  Neurological: He is alert and oriented to person,  place, and time. No cranial nerve deficit. Coordination normal.  Skin: Skin is warm and dry. He is not diaphoretic.  Psychiatric: He has a normal mood and affect.  Nursing note and vitals reviewed.   No results found for this or any previous visit (from the past 72 hour(s)).  No results found.  ASSESSMENT AND PLAN  Davin was seen today for other.  Diagnoses and all orders for this visit:  Routine screening for STI (sexually transmitted infection) -     HIV antibody -     RPR -     GC/Chlamydia Probe Amp  Exposure to chlamydia: Will treat base on HPI. Advised he avoid sex with his partner until the work up is complete.  -     cefTRIAXone (ROCEPHIN) injection 250 mg; Inject 0.25 g (250 mg total) into the muscle once. -     azithromycin (ZITHROMAX) 250 MG tablet; Take 4 tabs once today.    The patient was advised to call or return to clinic if he does not see an improvement in symptoms, or to seek the care of the closest emergency department if he worsens with the above plan.   Deliah Boston, MHS, PA-C [facility] 03/28/2016 5:03 PM

## 2016-03-28 NOTE — Patient Instructions (Signed)
     IF you received an x-ray today, you will receive an invoice from [facility]. Please contact [facility] at [phone] with questions or concerns regarding your invoice.   IF you received labwork today, you will receive an invoice from [facility]. Please contact [facility] at [phone] with questions or concerns regarding your invoice.   Our billing staff will not be able to assist you with questions regarding bills from these companies.  You will be contacted with the lab results as soon as they are available. The fastest way to get your results is to activate your My Chart account. Instructions are located on the last page of this paperwork. If you have not heard from us regarding the results in 2 weeks, please contact this office.      

## 2016-03-29 LAB — GC/CHLAMYDIA PROBE AMP
CT Probe RNA: NOT DETECTED
GC Probe RNA: NOT DETECTED

## 2016-03-29 LAB — RPR

## 2016-03-29 LAB — HIV ANTIBODY (ROUTINE TESTING W REFLEX): HIV 1&2 Ab, 4th Generation: NONREACTIVE

## 2016-06-09 ENCOUNTER — Ambulatory Visit (INDEPENDENT_AMBULATORY_CARE_PROVIDER_SITE_OTHER): Payer: BLUE CROSS/BLUE SHIELD | Admitting: Physician Assistant

## 2016-06-09 VITALS — BP 118/80 | HR 65 | Temp 97.8°F | Resp 16 | Ht 75.0 in | Wt 168.0 lb

## 2016-06-09 DIAGNOSIS — K1379 Other lesions of oral mucosa: Secondary | ICD-10-CM

## 2016-06-09 DIAGNOSIS — Z23 Encounter for immunization: Secondary | ICD-10-CM

## 2016-06-09 MED ORDER — AMOXICILLIN-POT CLAVULANATE 875-125 MG PO TABS: 1.0000 | ORAL_TABLET | Freq: Two times a day (BID) | ORAL | 0 refills | Status: AC

## 2016-06-09 NOTE — Progress Notes (Signed)
[facility] [address], [place] [address] [phone]  Date:  06/09/2016   Name:  Adam Wallace   DOB:  Mar 15, 1991   MRN:  [id number]  PCP:  Pcp Not In System    History of Present Illness:  Adam Wallace is a [age] y.o. male patient who [facility] bump in mouth. In the back of the right side of his mouth.  1 month of a growth in the mouth.  It is painful, when he makes certain movements or chewing.  He has not followed up with a dentist.  He has been using hot salt water gargling.     There are no active problems to display for this patient.   Past Medical History:  Diagnosis Date  . Asthma     History reviewed. No pertinent surgical history.  Social History  Substance Use Topics  . Smoking status: Former Smoker    Quit date: 07/13/2015  . Smokeless tobacco: Never Used  . Alcohol use 0.6 oz/week    1 Cans of beer per week     Comment: every day    History reviewed. No pertinent family history.  No Known Allergies  Medication list has been reviewed and updated.  Current Outpatient Prescriptions on File Prior to Visit  Medication Sig Dispense Refill  . acetaminophen (TYLENOL) 325 MG tablet Take 650 mg by mouth every 6 (six) hours as needed for headache.    . albuterol (PROVENTIL HFA;VENTOLIN HFA) 108 (90 BASE) MCG/ACT inhaler Inhale 1-2 puffs into the lungs every 6 (six) hours as needed for wheezing. 1 Inhaler 0  . azithromycin (ZITHROMAX) 250 MG tablet Take 4 tabs once today. 4 tablet 0  . naproxen (NAPROSYN) 500 MG tablet Take 1 tablet (500 mg total) by mouth 2 (two) times daily. 30 tablet 0  . oxyCODONE-acetaminophen (PERCOCET/ROXICET) 5-325 MG tablet Take 1-2 tablets by mouth every 6 (six) hours as needed for severe pain. 15 tablet 0   No current facility-administered medications on file prior to visit.     ROS ROS otherwise unremarkable unless listed above.   Physical Examination: BP 118/80 (BP Location: Left Arm, Patient  Position: Sitting, Cuff Size: Normal)   Pulse 65   Temp 97.8 F (36.6 C) (Oral)   Resp 16   Ht 6\' 3"  (1.905 m)   Wt 168 lb (76.2 kg)   SpO2 99%   BMI 21.00 kg/m  Ideal Body Weight: Weight in (lb) to have BMI = 25: 199.6  Physical Exam  Constitutional: He is oriented to person, place, and time. He appears well-developed and well-nourished. No distress.  HENT:  Head: Normocephalic and atraumatic.  Mouth/Throat: No dental caries. No oropharyngeal exudate, posterior oropharyngeal edema or posterior oropharyngeal erythema.  Third molar of the upper right side is growing out laterally. With clamping down of the teeth it compresses the tissue of the jaw where there is tenderness and minimal growth. The growth is without erythema or exudate. Consistent with chronic irritation.  Eyes: Conjunctivae and EOM are normal. Pupils are equal, round, and reactive to light.  Cardiovascular: Normal rate.   Pulmonary/Chest: Effort normal. No respiratory distress.  Neurological: He is alert and oriented to person, place, and time.  Skin: Skin is warm and dry. He is not diaphoretic.  Psychiatric: He has a normal mood and affect. His behavior is normal.     Assessment and Plan: Adam Wallace is a 25 y.o. male who is here today for mouth  pain and lesion. This is secondary to his third molar abnormal growth. I've advised him to continue with the warm salt water gargles. He can also use an Anbesol/oral numbing solution for pain. I've advised him to follow-up with a DMD as these will likely need to be extracted.  Will cover for possible infection, though this is least likely .Flu vaccine need - Plan: Flu Vaccine QUAD 36+ mos IM  Trena Platt, PA-C [facility] 10/5/20179:51 AM

## 2016-06-09 NOTE — Patient Instructions (Addendum)
You can contact Ocie DoyneScott Jensen, DMD for appointment.  Say you likely need your wisdom teeth extracted.  Confirm you have insurance.  There are also dental cards that you can obtain.   You can use ambesol for the pain.  This is over the counter.   IF you received an x-ray today, you will receive an invoice from [facility]. Please contact [facility] at [phone] with questions or concerns regarding your invoice.   IF you received labwork today, you will receive an invoice from [facility]. Please contact [facility] at [phone] with questions or concerns regarding your invoice.   Our billing staff will not be able to assist you with questions regarding bills from these companies.  You will be contacted with the lab results as soon as they are available. The fastest way to get your results is to activate your My Chart account. Instructions are located on the last page of this paperwork. If you have not heard from us regarding the results in 2 weeks, please contact this office.

## 2016-12-24 ENCOUNTER — Encounter (HOSPITAL_COMMUNITY): Payer: Self-pay | Admitting: Emergency Medicine

## 2016-12-24 ENCOUNTER — Emergency Department (HOSPITAL_COMMUNITY)
Admission: EM | Admit: 2016-12-24 | Discharge: 2016-12-24 | Disposition: A | Discharge status: Home / Self Care | Payer: BLUE CROSS/BLUE SHIELD | Attending: Emergency Medicine | Admitting: Emergency Medicine

## 2016-12-24 DIAGNOSIS — F17210 Nicotine dependence, cigarettes, uncomplicated: Secondary | ICD-10-CM | POA: Insufficient documentation

## 2016-12-24 DIAGNOSIS — Z79899 Other long term (current) drug therapy: Secondary | ICD-10-CM | POA: Insufficient documentation

## 2016-12-24 DIAGNOSIS — J45909 Unspecified asthma, uncomplicated: Secondary | ICD-10-CM | POA: Insufficient documentation

## 2016-12-24 DIAGNOSIS — K0889 Other specified disorders of teeth and supporting structures: Secondary | ICD-10-CM

## 2016-12-24 MED ORDER — PENICILLIN V POTASSIUM 500 MG PO TABS: 500.0000 mg | ORAL_TABLET | Freq: Four times a day (QID) | ORAL | 0 refills | Status: AC

## 2016-12-24 MED ORDER — IBUPROFEN 800 MG PO TABS: 800.0000 mg | ORAL_TABLET | Freq: Three times a day (TID) | ORAL | 0 refills | Status: AC

## 2016-12-24 MED ORDER — CHLORHEXIDINE GLUCONATE 0.12 % MT SOLN: 15.0000 mL | Freq: Two times a day (BID) | OROMUCOSAL | 0 refills | Status: DC

## 2016-12-24 NOTE — ED Provider Notes (Signed)
[facility]-EMERGENCY DEPT Provider Note   CSN: [id number] Arrival date & time: 12/24/16  1430  By signing my name below, I, Sonum Patel, attest that this documentation has been prepared under the direction and in the presence of [employer] . Electronically Signed: Sonum Patel, Neurosurgeon. 12/24/16. 3:07 PM.  History   Chief Complaint Chief Complaint  Patient presents with  . Dental Pain    The history is provided by the patient. No language interpreter was used.    HPI Comments: Adam Wallace is a 26 y.o. male who presents to the Emergency Department complaining of intermittent, unchanged right lower dental pain that began 1-2 days ago. He describes the pain as throbbing and rates it as 10/10. He reports associated sensation of right sided facial swelling. He states hot/cold liquids or exposure to air aggravate the pain. He has tried Aleve and Orajel with mild relief. He also tried saltwater and hydrogen peroxide gargles with minimal relief. He states he has not had pain like this before. He denies any drainage or bleeding from his mouth. He denies fever, chills, CP, SOB, abdominal pain, nausea, vomiting, diarrhea, neck pain, back pain, HA, numbness, weakness, dysuria, difficulty urinating.   Past Medical History:  Diagnosis Date  . Asthma     There are no active problems to display for this patient.   History reviewed. No pertinent surgical history.     Home Medications    Prior to Admission medications   Medication Sig Start Date End Date Taking? Authorizing Provider  acetaminophen (TYLENOL) 325 MG tablet Take 650 mg by mouth every 6 (six) hours as needed for headache.    Historical Provider, MD  albuterol (PROVENTIL HFA;VENTOLIN HFA) 108 (90 BASE) MCG/ACT inhaler Inhale 1-2 puffs into the lungs every 6 (six) hours as needed for wheezing. 06/09/14   Benjiman Core, MD  azithromycin (ZITHROMAX) 250 MG tablet Take 4 tabs once today. 03/28/16   Ofilia Neas, PA-C  chlorhexidine  (PERIDEX) 0.12 % solution Use as directed 15 mLs in the mouth or throat 2 (two) times daily. 12/24/16   Iran Rowe A Zakarie Sturdivant, PA-C  ibuprofen (ADVIL,MOTRIN) 800 MG tablet Take 1 tablet (800 mg total) by mouth 3 (three) times daily. 12/24/16 12/29/16  Martinez Boxx A Emilie Carp, PA-C  naproxen (NAPROSYN) 500 MG tablet Take 1 tablet (500 mg total) by mouth 2 (two) times daily. 09/13/15   Antony Madura, PA-C  oxyCODONE-acetaminophen (PERCOCET/ROXICET) 5-325 MG tablet Take 1-2 tablets by mouth every 6 (six) hours as needed for severe pain. 09/13/15   Antony Madura, PA-C  penicillin v potassium (VEETID) 500 MG tablet Take 1 tablet (500 mg total) by mouth 4 (four) times daily. 12/24/16 12/31/16  Jeanie Sewer, PA-C    Family History Family History  Problem Relation Age of Onset  . Diabetes Other     Social History Social History  Substance Use Topics  . Smoking status: Current Every Day Smoker    Types: Cigarettes  . Smokeless tobacco: Never Used  . Alcohol use 0.6 oz/week    1 Cans of beer per week     Comment: social     Allergies   Patient has no known allergies.   Review of Systems Review of Systems  Constitutional: Negative for chills and fever.  HENT: Positive for dental problem and facial swelling.   Respiratory: Negative for shortness of breath.   Cardiovascular: Negative for chest pain.  Gastrointestinal: Negative for abdominal pain, diarrhea, nausea and vomiting.  Genitourinary: Negative for difficulty urinating  and dysuria.  Musculoskeletal: Negative for back pain and neck pain.  Neurological: Negative for weakness, numbness and headaches.     Physical Exam Updated Vital Signs BP 113/90 (BP Location: Left Arm)   Pulse (!) 57   Temp 98.1 F (36.7 C) (Oral)   Resp 20   Ht  (1.93 m)   Wt 75.3 kg   SpO2 100%   BMI 20.21 kg/m   Physical Exam  Constitutional: He is oriented to person, place, and time. He appears well-developed and well-nourished.  HENT:  Head: Normocephalic and atraumatic.    Right Ear: Tympanic membrane normal.  Left Ear: Tympanic membrane normal.  Nose: Nose normal.  No obvious facial swelling. No obvious cavity or cracked dentition but there are diffuse plaques and tartar built up. No area of fluctuance or erythema along buccal mucosa. Gingiva appears pink and healthy with no sign of inflammation. There is a small 2 mm x 2 mm ulceration of the buccal mucosa on the right near the right lower 3rd molar with no drainage or bleeding. No posterior pharynx erythema, tonsillar hypertrophy, exudates, or uvular deviation.   Cardiovascular: Normal rate.   Pulmonary/Chest: Effort normal.  Lymphadenopathy:    He has no cervical adenopathy.  Neurological: He is alert and oriented to person, place, and time.  Skin: Skin is warm and dry.  Psychiatric: He has a normal mood and affect.  Nursing note and vitals reviewed.    ED Treatments / Results  DIAGNOSTIC STUDIES: Oxygen Saturation is 100% on RA, normal by my interpretation.    COORDINATION OF CARE: 3:05 PM Discussed treatment plan with pt at bedside and pt agreed to plan.   Labs (all labs ordered are listed, but only abnormal results are displayed) Labs Reviewed - No data to display  EKG  EKG Interpretation None       Radiology No results found.  Procedures Procedures (including critical care time)  Medications Ordered in ED Medications - No data to display   Initial Impression / Assessment and Plan / ED Course  I have reviewed the triage vital signs and the nursing notes.  Pertinent labs & imaging results that were available during my care of the patient were reviewed by me and considered in my medical decision making (see chart for details).     Patient with dentalgia.  Afebrile, vital signs stable. No abscess requiring immediate incision and drainage.  Exam not concerning for Ludwig's angina or pharyngeal abscess.  Will treat with PCN, ibuprofen, and chlorhexidine wash. Pt instructed to  follow-up with dentist.  Discussed return precautions. Pt verbalized understanding of and agreement with plan and is safe for discharge home at this time.   Final Clinical Impressions(s) / ED Diagnoses   Final diagnoses:  Pain, dental    New Prescriptions Discharge Medication List as of 12/24/2016  3:11 PM    START taking these medications   Details  chlorhexidine (PERIDEX) 0.12 % solution Use as directed 15 mLs in the mouth or throat 2 (two) times daily., Starting Sat 12/24/2016, Print    ibuprofen (ADVIL,MOTRIN) 800 MG tablet Take 1 tablet (800 mg total) by mouth 3 (three) times daily., Starting Sat 12/24/2016, Until Thu 12/29/2016, Print    penicillin v potassium (VEETID) 500 MG tablet Take 1 tablet (500 mg total) by mouth 4 (four) times daily., Starting Sat 12/24/2016, Until Sat 12/31/2016, Print       I personally performed the services described in this documentation, which was scribed in my  presence. The recorded information has been reviewed and is accurate.    Jeanie Sewer, PA-C 12/25/16 2116    Lyndal Pulley, MD 12/26/16 [phone]

## 2016-12-24 NOTE — Discharge Instructions (Signed)
Please take all of your antibiotics until finished!   You may develop abdominal discomfort or diarrhea from the antibiotic.  You may help offset this with probiotics which you can buy or get in yogurt. Do not eat  or take the probiotics until 2 hours after your antibiotic. Peridex mouthwash twice daily. I have attached the dental resource guide with information about dental clinics in the area. Please call to schedule an appointment as soon as you can.

## 2016-12-24 NOTE — ED Triage Notes (Signed)
Pt dental pain in lower r/side of mouth x 2 days. Facial swelling noted

## 2017-02-01 ENCOUNTER — Ambulatory Visit: Payer: BLUE CROSS/BLUE SHIELD | Admitting: Family Medicine

## 2017-02-01 ENCOUNTER — Encounter (HOSPITAL_COMMUNITY): Payer: Self-pay | Admitting: Emergency Medicine

## 2017-02-01 ENCOUNTER — Emergency Department (HOSPITAL_COMMUNITY)
Admission: EM | Admit: 2017-02-01 | Discharge: 2017-02-01 | Disposition: A | Discharge status: Home / Self Care | Payer: BC Managed Care – PPO | Attending: Emergency Medicine | Admitting: Emergency Medicine

## 2017-02-01 DIAGNOSIS — K047 Periapical abscess without sinus: Secondary | ICD-10-CM

## 2017-02-01 DIAGNOSIS — Z7951 Long term (current) use of inhaled steroids: Secondary | ICD-10-CM | POA: Insufficient documentation

## 2017-02-01 DIAGNOSIS — K0889 Other specified disorders of teeth and supporting structures: Secondary | ICD-10-CM | POA: Insufficient documentation

## 2017-02-01 DIAGNOSIS — F17210 Nicotine dependence, cigarettes, uncomplicated: Secondary | ICD-10-CM | POA: Insufficient documentation

## 2017-02-01 DIAGNOSIS — J45909 Unspecified asthma, uncomplicated: Secondary | ICD-10-CM | POA: Diagnosis not present

## 2017-02-01 MED ORDER — CLINDAMYCIN HCL 150 MG PO CAPS: 300.0000 mg | ORAL_CAPSULE | Freq: Four times a day (QID) | ORAL | 0 refills | Status: DC

## 2017-02-01 NOTE — ED Triage Notes (Signed)
Pt reports right lower toothache. Obvious edema noted. Possible tooth abscess. No fever. Pain 9/10.

## 2017-02-01 NOTE — Discharge Instructions (Signed)
You have a dental infection. It is very important that you get evaluated by a dentist as soon as possible. Call tomorrow to schedule an appointment. Ibuprofen as needed for pain. Take your full course of antibiotics. Read the instructions below. ° °Eat a soft or liquid diet and rinse your mouth out after meals with warm water. You should see a dentist or return here at once if you have increased swelling, increased pain or uncontrolled bleeding from the site of your injury. ° °SEEK MEDICAL CARE IF:  °You have increased pain not controlled with medicines.  °You have swelling around your tooth, in your face or neck.  °You have bleeding which starts, continues, or gets worse.  °You have a fever >101 °If you are unable to open your mouth °

## 2017-02-01 NOTE — ED Provider Notes (Signed)
[facility]EMERGENCY DEPT Provider Note   CSN: [id number] Arrival date & time: 02/01/17  1024 By signing my name below, I, Bridgette HabermannMaria Tan, attest that this documentation has been prepared under the direction and in the presence of [facility] LLCJaime Ward, PA-C. Electronically Signed: Bridgette HabermannMaria Tan, ED Scribe. 02/01/17. 3:43 PM.  History   Chief Complaint Chief Complaint  Patient presents with  . Dental Pain    HPI The history is provided by the patient. No language interpreter was used.   HPI Comments: Adam Wallace is a 26 y.o. male with h/o asthma, who presents to the Emergency Department complaining of right lower dental pain with swelling beginning several days ago. Pt rates his current pain a 9/10 and states it is aching in quality. Pain is exacerbated with chewing. He notes he has not tried any OTC medications PTA. Pt denies fever, chills, or any other associated symptoms.   Past Medical History:  Diagnosis Date  . Asthma     There are no active problems to display for this patient.   History reviewed. No pertinent surgical history.     Home Medications    Prior to Admission medications   Medication Sig Start Date End Date Taking? Authorizing Provider  acetaminophen (TYLENOL) 325 MG tablet Take 650 mg by mouth every 6 (six) hours as needed for headache.    [provider]  albuterol (PROVENTIL HFA;VENTOLIN HFA) 108 (90 BASE) MCG/ACT inhaler Inhale 1-2 puffs into the lungs every 6 (six) hours as needed for wheezing. 06/09/14   Benjiman CorePickering, Nathan, MD  azithromycin (ZITHROMAX) 250 MG tablet Take 4 tabs once today. 03/28/16   Ofilia Neaslark, Michael L, PA-C  chlorhexidine (PERIDEX) 0.12 % solution Use as directed 15 mLs in the mouth or throat 2 (two) times daily. 12/24/16   Luevenia MaxinFawze, Mina A, PA-C  clindamycin (CLEOCIN) 150 MG capsule Take 2 capsules (300 mg total) by mouth 4 (four) times daily. 02/01/17   Ward, Chase PicketJaime Pilcher, PA-C  naproxen (NAPROSYN) 500 MG tablet Take 1 tablet (500 mg total) by mouth 2  (two) times daily. 09/13/15   Antony MaduraHumes, Kelly, PA-C  oxyCODONE-acetaminophen (PERCOCET/ROXICET) 5-325 MG tablet Take 1-2 tablets by mouth every 6 (six) hours as needed for severe pain. 09/13/15   Antony MaduraHumes, Kelly, PA-C    Family History Family History  Problem Relation Age of Onset  . Diabetes Other     Social History Social History  Substance Use Topics  . Smoking status: Current Every Day Smoker    Types: Cigarettes  . Smokeless tobacco: Never Used  . Alcohol use 0.6 oz/week    1 Cans of beer per week     Comment: social     Allergies   Patient has no known allergies.   Review of Systems Review of Systems  Constitutional: Negative for chills and fever.  HENT: Positive for dental problem and facial swelling.   Respiratory: Negative for shortness of breath.      Physical Exam Updated Vital Signs BP 117/82 (BP Location: Left Arm)   Pulse 65   Temp 98.4 F (36.9 C) (Oral)   Resp 18   Ht 6\' 3"  (1.905 m)   Wt 70.3 kg (155 lb)   SpO2 100%   BMI 19.37 kg/m   Physical Exam  Constitutional: He appears well-developed and well-nourished. No distress.  HENT:  Head: Normocephalic and atraumatic.   Pain along right lower molar with associated facial swelling. No abscess requiring I&D appreciated. Midline uvula. No trismus. OP moist and clear. No oropharyngeal erythema  or edema. Neck supple with no tenderness.  Neck: Neck supple.  Cardiovascular: Normal rate, regular rhythm and normal heart sounds.   No murmur heard. Pulmonary/Chest: Effort normal and breath sounds normal. No respiratory distress. He has no wheezes. He has no rales.  Musculoskeletal: Normal range of motion.  Neurological: He is alert.  Skin: Skin is warm and dry.  Nursing note and vitals reviewed.    ED Treatments / Results  DIAGNOSTIC STUDIES: Oxygen Saturation is 100% on RA, normal by my interpretation.   COORDINATION OF CARE: 3:43 PM-Discussed next steps with pt. Pt verbalized understanding and is  agreeable with the plan.   Labs (all labs ordered are listed, but only abnormal results are displayed) Labs Reviewed - No data to display  EKG  EKG Interpretation None       Radiology No results found.  Procedures Procedures (including critical care time)  Medications Ordered in ED Medications - No data to display   Initial Impression / Assessment and Plan / ED Course  I have reviewed the triage vital signs and the nursing notes.  Pertinent labs & imaging results that were available during my care of the patient were reviewed by me and considered in my medical decision making (see chart for details).    Adam Wallace is a 26 y.o. male who presents to ED for dental pain. No abscess requiring immediate incision and drainage. Patient is afebrile, non toxic appearing, and swallowing secretions well. Exam not concerning for Ludwig's angina or pharyngeal abscess. Will treat with clinda. I provided dental referral and stressed the importance of dental follow up for ultimate management of dental pain. Patient voices understanding and is agreeable to plan.  Final Clinical Impressions(s) / ED Diagnoses   Final diagnoses:  Dental infection    New Prescriptions Discharge Medication List as of 02/01/2017 12:22 PM    START taking these medications   Details  clindamycin (CLEOCIN) 150 MG capsule Take 2 capsules (300 mg total) by mouth 4 (four) times daily., Starting Wed 02/01/2017, Print       I personally performed the services described in this documentation, which was scribed in my presence. The recorded information has been reviewed and is accurate.     Ward, Chase Picket, PA-C 02/01/17 Shane Crutch    Jerelyn Scott, MD 02/01/17 [phone]

## 2017-10-04 ENCOUNTER — Ambulatory Visit: Payer: BLUE CROSS/BLUE SHIELD | Admitting: Physician Assistant

## 2020-01-27 ENCOUNTER — Encounter: Payer: Self-pay | Admitting: Family Medicine

## 2020-01-27 ENCOUNTER — Telehealth (INDEPENDENT_AMBULATORY_CARE_PROVIDER_SITE_OTHER): Payer: Managed Care, Other (non HMO) | Admitting: Family Medicine

## 2020-01-27 ENCOUNTER — Other Ambulatory Visit: Payer: Self-pay

## 2020-01-27 DIAGNOSIS — L03115 Cellulitis of right lower limb: Secondary | ICD-10-CM | POA: Diagnosis not present

## 2020-01-27 MED ORDER — DOXYCYCLINE HYCLATE 100 MG PO TABS: 100.0000 mg | ORAL_TABLET | Freq: Two times a day (BID) | ORAL | 0 refills | Status: DC

## 2020-01-27 NOTE — Progress Notes (Signed)
Virtual Visit via Video Note  I connected with Adam Wallace on 01/27/20 at 5:30 PM by a video enabled telemedicine application and verified that I am speaking with the correct person using two identifiers.   I discussed the limitations, risks, security and privacy concerns of performing an evaluation and management service by telephone and the availability of in person appointments. I also discussed with the patient that there may be a patient responsible charge related to this service. The patient expressed understanding and agreed to proceed, consent obtained  Chief complaint:  Chief Complaint  Patient presents with  . Insect Bite    pt noticed a bump 2-3 days ago, started as a bump, had some itching, red rings and rash around the affected area, swelling and tender, applied alchol to help with the itching and has helped some with the swelling. pt is in [place]      History of Present Illness: Adam Wallace is a 29 y.o. male  Currently out of state. Drives for [employer]. On way home from [place].  Noticed a bump few days ago on R leg, initially itching, worse today and leg looked more swollen in area with red rings.  Itching and swelling a little better with topical alcohol on it.  No known bite/sting. ?  No discharge.  No fever. Feels well otherwise.    There are no problems to display for this patient.  Past Medical History:  Diagnosis Date  . Asthma    No past surgical history on file. No Known Allergies Prior to Admission medications   Medication Sig Start Date End Date Taking? Authorizing Provider  acetaminophen (TYLENOL) 325 MG tablet Take 650 mg by mouth every 6 (six) hours as needed for headache.   Yes [provider]  albuterol (PROVENTIL HFA;VENTOLIN HFA) 108 (90 BASE) MCG/ACT inhaler Inhale 1-2 puffs into the lungs every 6 (six) hours as needed for wheezing. 06/09/14  Yes Benjiman Core, MD  azithromycin (ZITHROMAX) 250 MG tablet Take 4 tabs  once today. 03/28/16  Yes Ofilia Neas, PA-C  chlorhexidine (PERIDEX) 0.12 % solution Use as directed 15 mLs in the mouth or throat 2 (two) times daily. 12/24/16  Yes Fawze, Mina A, PA-C  clindamycin (CLEOCIN) 150 MG capsule Take 2 capsules (300 mg total) by mouth 4 (four) times daily. 02/01/17  Yes Ward, Chase Picket, PA-C  naproxen (NAPROSYN) 500 MG tablet Take 1 tablet (500 mg total) by mouth 2 (two) times daily. 09/13/15  Yes Antony Madura, PA-C  oxyCODONE-acetaminophen (PERCOCET/ROXICET) 5-325 MG tablet Take 1-2 tablets by mouth every 6 (six) hours as needed for severe pain. 09/13/15  Yes Antony Madura, PA-C   Social History   Socioeconomic History  . Marital status: Single    Spouse name: Not on file  . Number of children: Not on file  . Years of education: Not on file  . Highest education level: Not on file  Occupational History  . Not on file  Tobacco Use  . Smoking status: Current Every Day Smoker    Types: Cigarettes  . Smokeless tobacco: Never Used  Substance and Sexual Activity  . Alcohol use: Yes    Alcohol/week: 1.0 standard drinks    Types: 1 Cans of beer per week    Comment: social  . Drug use: No  . Sexual activity: Not on file  Other Topics Concern  . Not on file  Social History Narrative  . Not on file   Social Determinants of Health  Financial Resource Strain:   . Difficulty of Paying Living Expenses:   Food Insecurity:   . Worried About Charity fundraiser in the Last Year:   . Arboriculturist in the Last Year:   Transportation Needs:   . Film/video editor (Medical):   Marland Kitchen Lack of Transportation (Non-Medical):   Physical Activity:   . Days of Exercise per Week:   . Minutes of Exercise per Session:   Stress:   . Feeling of Stress :   Social Connections:   . Frequency of Communication with Friends and Family:   . Frequency of Social Gatherings with Friends and Family:   . Attends Religious Services:   . Active Member of Clubs or Organizations:    . Attends Archivist Meetings:   Marland Kitchen Marital Status:   Intimate Partner Violence:   . Fear of Current or Ex-Partner:   . Emotionally Abused:   Marland Kitchen Physically Abused:   . Sexually Abused:     Observations/Objective: There were no vitals filed for this visit.  approx 4x9cm erytema R medial thigh - no proximal streaks, no wounds or bleeding noted.  Assessment and Plan: Cellulitis of leg, right - Plan: doxycycline (VIBRA-TABS) 100 MG tablet  -Initial itching, followed by erythema, induration based on his description of symptoms, with erythema noted on video as above.  Possible initial insect bite but concerning for possible secondary cellulitis.  Slight improvement today.  Will cover with doxycycline 100 mg twice daily, side effects discussed, ER/RTC precautions given.  Over-the-counter nonsedating/less sedating antihistamine okay if needed for itching.  Follow Up Instructions:   as needed.   I discussed the assessment and treatment plan with the patient. The patient was provided an opportunity to ask questions and all were answered. The patient agreed with the plan and demonstrated an understanding of the instructions.   The patient was advised to call back or seek an in-person evaluation if the symptoms worsen or if the condition fails to improve as anticipated.  I provided 13 minutes of non-face-to-face time during this encounter.   Wendie Agreste, MD

## 2020-08-31 ENCOUNTER — Ambulatory Visit (HOSPITAL_COMMUNITY)
Admission: EM | Admit: 2020-08-31 | Discharge: 2020-08-31 | Disposition: A | Discharge status: Home / Self Care | Payer: HRSA Program | Attending: Physician Assistant | Admitting: Physician Assistant

## 2020-08-31 ENCOUNTER — Other Ambulatory Visit: Payer: Self-pay

## 2020-08-31 ENCOUNTER — Encounter (HOSPITAL_COMMUNITY): Payer: Self-pay

## 2020-08-31 DIAGNOSIS — Z20822 Contact with and (suspected) exposure to covid-19: Secondary | ICD-10-CM

## 2020-08-31 DIAGNOSIS — U071 COVID-19: Secondary | ICD-10-CM | POA: Diagnosis not present

## 2020-08-31 DIAGNOSIS — M545 Low back pain, unspecified: Secondary | ICD-10-CM | POA: Diagnosis present

## 2020-08-31 DIAGNOSIS — J029 Acute pharyngitis, unspecified: Secondary | ICD-10-CM

## 2020-08-31 DIAGNOSIS — R519 Headache, unspecified: Secondary | ICD-10-CM | POA: Diagnosis present

## 2020-08-31 NOTE — Discharge Instructions (Addendum)
Take ibuprofen or tylenol as needed for pain Drink plenty of water Get plenty of rest  

## 2020-08-31 NOTE — ED Provider Notes (Signed)
[facility]    CSN: [id number] Arrival date & time: 08/31/20  1807      History   Chief Complaint Chief Complaint  Patient presents with  . Headache  . Generalized Body Aches  . Sore Throat  . Chills    HPI Adam Wallace is a 29 y.o. male.   Patient here c/w myalgia and sore throat x today.  Exposed to COVID all weekend long.  He has had COVID vaccine.  Denies f/c, otalgia, n/v/d, abdominal pain, wheezing, SOB.  No advil or tylenol today.     Past Medical History:  Diagnosis Date  . Asthma     There are no problems to display for this patient.   History reviewed. No pertinent surgical history.     Home Medications    Prior to Admission medications   Medication Sig Start Date End Date Taking? Authorizing Provider  acetaminophen (TYLENOL) 325 MG tablet Take 650 mg by mouth every 6 (six) hours as needed for headache.    [provider]  albuterol (PROVENTIL HFA;VENTOLIN HFA) 108 (90 BASE) MCG/ACT inhaler Inhale 1-2 puffs into the lungs every 6 (six) hours as needed for wheezing. 06/09/14   Benjiman Core, MD  azithromycin (ZITHROMAX) 250 MG tablet Take 4 tabs once today. 03/28/16   Ofilia Neas, PA-C  chlorhexidine (PERIDEX) 0.12 % solution Use as directed 15 mLs in the mouth or throat 2 (two) times daily. 12/24/16   Luevenia Maxin, Mina A, PA-C  clindamycin (CLEOCIN) 150 MG capsule Take 2 capsules (300 mg total) by mouth 4 (four) times daily. 02/01/17   Ward, Chase Picket, PA-C  doxycycline (VIBRA-TABS) 100 MG tablet Take 1 tablet (100 mg total) by mouth 2 (two) times daily. 01/27/20   Shade Flood, MD  naproxen (NAPROSYN) 500 MG tablet Take 1 tablet (500 mg total) by mouth 2 (two) times daily. 09/13/15   Antony Madura, PA-C  oxyCODONE-acetaminophen (PERCOCET/ROXICET) 5-325 MG tablet Take 1-2 tablets by mouth every 6 (six) hours as needed for severe pain. 09/13/15   Antony Madura, PA-C    Family History Family History  Problem Relation Age of Onset   . Diabetes Other     Social History Social History   Tobacco Use  . Smoking status: Current Every Day Smoker    Types: Cigarettes  . Smokeless tobacco: Never Used  Substance Use Topics  . Alcohol use: Yes    Alcohol/week: 1.0 standard drink    Types: 1 Cans of beer per week    Comment: social  . Drug use: No     Allergies   Patient has no known allergies.   Review of Systems Review of Systems  Constitutional: Positive for chills. Negative for fatigue and fever.  HENT: Positive for sore throat. Negative for congestion, ear pain, nosebleeds, postnasal drip, rhinorrhea, sinus pressure and sinus pain.   Eyes: Negative for pain and redness.  Respiratory: Negative for cough, shortness of breath and wheezing.   Gastrointestinal: Negative for abdominal pain, diarrhea, nausea and vomiting.  Musculoskeletal: Positive for back pain and myalgias. Negative for arthralgias.  Skin: Negative for rash.  Neurological: Positive for headaches. Negative for light-headedness.  Hematological: Negative for adenopathy. Does not bruise/bleed easily.  Psychiatric/Behavioral: Negative for confusion and sleep disturbance.     Physical Exam Triage Vital Signs ED Triage Vitals  Enc Vitals Group     BP 08/31/20 2030 124/62     Pulse Rate 08/31/20 2030 (!) 104     Resp 08/31/20 2030  20     Temp 08/31/20 2030 98.9 F (37.2 C)     Temp Source 08/31/20 2030 Oral     SpO2 08/31/20 2030 99 %     Weight --      Height --      Head Circumference --      Peak Flow --      Pain Score 08/31/20 2029 9     Pain Loc --      Pain Edu? --      Excl. in GC? --    No data found.  Updated Vital Signs BP 124/62 (BP Location: Right Arm)   Pulse (!) 104   Temp 98.9 F (37.2 C) (Oral)   Resp 20   SpO2 99%   Visual Acuity Right Eye Distance:   Left Eye Distance:   Bilateral Distance:    Right Eye Near:   Left Eye Near:    Bilateral Near:     Physical Exam Vitals and nursing note reviewed.   Constitutional:      General: He is not in acute distress.    Appearance: Normal appearance. He is well-developed. He is ill-appearing. He is not toxic-appearing.  HENT:     Head: Normocephalic and atraumatic.     Nose: Nose normal.     Mouth/Throat:     Mouth: Mucous membranes are moist.  Eyes:     General: No scleral icterus.    Extraocular Movements: Extraocular movements intact.     Conjunctiva/sclera: Conjunctivae normal.  Pulmonary:     Effort: Pulmonary effort is normal. No respiratory distress.  Musculoskeletal:     Cervical back: Normal range of motion and neck supple. No rigidity.  Lymphadenopathy:     Cervical: No cervical adenopathy.  Skin:    General: Skin is warm and dry.     Capillary Refill: Capillary refill takes less than 2 seconds.  Neurological:     General: No focal deficit present.     Mental Status: He is alert and oriented to person, place, and time.     Motor: No weakness.     Gait: Gait normal.  Psychiatric:        Mood and Affect: Mood normal.        Behavior: Behavior normal.      UC Treatments / Results  Labs (all labs ordered are listed, but only abnormal results are displayed) Labs Reviewed  SARS CORONAVIRUS 2 (TAT 6-24 HRS)    EKG   Radiology No results found.  Procedures Procedures (including critical care time)  Medications Ordered in UC Medications - No data to display  Initial Impression / Assessment and Plan / UC Course  I have reviewed the triage vital signs and the nursing notes.  Pertinent labs & imaging results that were available during my care of the patient were reviewed by me and considered in my medical decision making (see chart for details).     Remain in quarantine per CDC guidelines. Final Clinical Impressions(s) / UC Diagnoses   Final diagnoses:  Pharyngitis, unspecified etiology  Acute nonintractable headache, unspecified headache type  Acute low back pain without sciatica, unspecified back pain  laterality  Contact with and (suspected) exposure to covid-19     Discharge Instructions     Take ibuprofen or tylenol as needed for pain. Drink plenty of water Get plenty of rest.   ED Prescriptions    None     PDMP not reviewed this encounter.   Evern Core, PA-C  08/31/20 2110  

## 2020-08-31 NOTE — ED Triage Notes (Signed)
Pt in with c/o body aches, headaches, chills and ST that started this morning  Pt has not had medication for sxs  Denies any n/v, diarrhea

## 2020-09-01 LAB — SARS CORONAVIRUS 2 (TAT 6-24 HRS): SARS Coronavirus 2: POSITIVE — AB

## 2020-11-23 ENCOUNTER — Encounter: Payer: Self-pay | Admitting: Registered Nurse

## 2020-11-23 ENCOUNTER — Ambulatory Visit (INDEPENDENT_AMBULATORY_CARE_PROVIDER_SITE_OTHER): Payer: 59 | Admitting: Registered Nurse

## 2020-11-23 ENCOUNTER — Other Ambulatory Visit: Payer: Self-pay

## 2020-11-23 VITALS — BP 136/76 | HR 90 | Temp 98.0°F | Resp 18 | Ht 75.0 in | Wt 167.8 lb

## 2020-11-23 DIAGNOSIS — M25561 Pain in right knee: Secondary | ICD-10-CM

## 2020-11-23 DIAGNOSIS — S8991XA Unspecified injury of right lower leg, initial encounter: Secondary | ICD-10-CM

## 2020-11-23 MED ORDER — TRAMADOL HCL 50 MG PO TABS: 50.0000 mg | ORAL_TABLET | Freq: Three times a day (TID) | ORAL | 0 refills | Status: AC | PRN

## 2020-11-23 MED ORDER — METHOCARBAMOL 500 MG PO TABS: 500.0000 mg | ORAL_TABLET | Freq: Four times a day (QID) | ORAL | 0 refills | Status: DC

## 2020-11-23 NOTE — Progress Notes (Signed)
Acute Office Visit  Subjective:    Patient ID: Adam Wallace, male    DOB: [birth year], 30 y.o.   MRN: [id number]  Chief Complaint  Patient presents with  . Knee Pain    Patient states he fell last Thursday at home and twisted his knee. Per patient he thought the pain will go away but it is still hurting and swollen. He has took ibuprofen for pain with no relief.    HPI Patient is in today for knee injury  Occurred last Thursday at home. Knee buckled inwards after trip, acute pain. Some swelling and ongoing pain since, 4/10 at rest, at worst can get to 9/10 Has been taking OTC analgesics with no relief Does feel unstable at times, has given out on him once. 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  Works as Hospital doctor for [employer] - has been limiting him to almost no ability at work  Past Medical History:  Diagnosis Date  . Asthma     No past surgical history on file.  Family History  Problem Relation Age of Onset  . Diabetes Other     Social History   Socioeconomic History  . Marital status: Single    Spouse name: Not on file  . Number of children: Not on file  . Years of education: Not on file  . Highest education level: Not on file  Occupational History  . Not on file  Tobacco Use  . Smoking status: Current Every Day Smoker    Types: Cigarettes  . Smokeless tobacco: Never Used  Substance and Sexual Activity  . Alcohol use: Yes    Alcohol/week: 1.0 standard drink    Types: 1 Cans of beer per week    Comment: social  . Drug use: No  . Sexual activity: Not on file  Other Topics Concern  . Not on file  Social History Narrative  . Not on file   Social Determinants of Health   Financial Resource Strain: Not on file  Food Insecurity: Not on file  Transportation Needs: Not on file  Physical Activity:  Not on file  Stress: Not on file  Social Connections: Not on file  Intimate Partner Violence: Not on file    Outpatient Medications Prior to Visit  Medication Sig Dispense Refill  . acetaminophen (TYLENOL) 325 MG tablet Take 650 mg by mouth every 6 (six) hours as needed for headache. (Patient not taking: Reported on 11/23/2020)    . albuterol (PROVENTIL HFA;VENTOLIN HFA) 108 (90 BASE) MCG/ACT inhaler Inhale 1-2 puffs into the lungs every 6 (six) hours as needed for wheezing. (Patient not taking: Reported on 11/23/2020) 1 Inhaler 0  . azithromycin (ZITHROMAX) 250 MG tablet Take 4 tabs once today. (Patient not taking: Reported on 11/23/2020) 4 tablet 0  . chlorhexidine (PERIDEX) 0.12 % solution Use as directed 15 mLs in the mouth or throat 2 (two) times daily. (Patient not taking: Reported on 11/23/2020) 120 mL 0  . clindamycin (CLEOCIN) 150 MG capsule Take 2 capsules (300 mg total) by mouth 4 (four) times daily. (Patient not taking: Reported on 11/23/2020) 56 capsule 0  . doxycycline (VIBRA-TABS) 100 MG tablet Take 1 tablet (100 mg total) by mouth 2 (two) times daily. (Patient not taking: Reported on 11/23/2020) 14 tablet 0  . naproxen (NAPROSYN) 500 MG  tablet Take 1 tablet (500 mg total) by mouth 2 (two) times daily. (Patient not taking: Reported on 11/23/2020) 30 tablet 0  . oxyCODONE-acetaminophen (PERCOCET/ROXICET) 5-325 MG tablet Take 1-2 tablets by mouth every 6 (six) hours as needed for severe pain. (Patient not taking: Reported on 11/23/2020) 15 tablet 0   No facility-administered medications prior to visit.    No Known Allergies  Review of Systems Per hpi      Objective:    Physical Exam Vitals and nursing note reviewed.  Constitutional:      Appearance: Normal appearance.  Cardiovascular:     Rate and Rhythm: Normal rate and regular rhythm.     Pulses: Normal pulses.     Heart sounds: Normal heart sounds. No murmur heard. No friction rub. No gallop.   Pulmonary:     Effort:  Pulmonary effort is normal. No respiratory distress.     Breath sounds: Normal breath sounds. No stridor. No wheezing, rhonchi or rales.  Musculoskeletal:        General: Swelling (mild, general L knee), tenderness (medial joint line of R knee) and signs of injury present.     Right lower leg: No edema.     Left lower leg: No edema.     Comments: Full ROM but with extreme pain with flexion in R knee. Anterior and posterior drawer negative Further exam limited by pain  Neurological:     General: No focal deficit present.     Mental Status: He is alert. Mental status is at baseline.     Sensory: No sensory deficit.     Motor: No weakness.  Psychiatric:        Mood and Affect: Mood normal.        Behavior: Behavior normal.        Thought Content: Thought content normal.        Judgment: Judgment normal.     BP 136/76   Pulse 90   Temp 98 F (36.7 C) (Temporal)   Resp 18   Ht 6\' 3"  (1.905 m)   Wt 167 lb 12.8 oz (76.1 kg)   SpO2 100%   BMI 20.97 kg/m  Wt Readings from Last 3 Encounters:  11/23/20 167 lb 12.8 oz (76.1 kg)  02/01/17 155 lb (70.3 kg)  12/24/16 166 lb 1 oz (75.3 kg)    There are no preventive care reminders to display for this patient.  There are no preventive care reminders to display for this patient.   No results found for: TSH Lab Results  Component Value Date   WBC 4.2 12/10/2015   HGB 14.9 12/10/2015   HCT 42.1 12/10/2015   MCV 84.9 12/10/2015   PLT 170 12/10/2015   Lab Results  Component Value Date   NA 138 12/10/2015   K 3.6 12/10/2015   CO2 26 12/10/2015   GLUCOSE 105 (H) 12/10/2015   BUN 8 12/10/2015   CREATININE 1.02 12/10/2015   BILITOT 0.5 12/10/2015   ALKPHOS 46 12/10/2015   AST 18 12/10/2015   ALT 14 (L) 12/10/2015   PROT 7.1 12/10/2015   ALBUMIN 4.0 12/10/2015   CALCIUM 9.0 12/10/2015   ANIONGAP 7 12/10/2015   No results found for: CHOL No results found for: HDL No results found for: LDLCALC No results found for: TRIG No  results found for: CHOLHDL No results found for: 02/09/2016     Assessment & Plan:   Problem List Items Addressed This Visit   None   Visit Diagnoses  Injury of right knee, initial encounter    -  Primary   Relevant Medications   traMADol (ULTRAM) 50 MG tablet   methocarbamol (ROBAXIN) 500 MG tablet   Other Relevant Orders   Ambulatory referral to Orthopedic Surgery   Acute pain of right knee       Relevant Medications   traMADol (ULTRAM) 50 MG tablet   methocarbamol (ROBAXIN) 500 MG tablet   Other Relevant Orders   Ambulatory referral to Orthopedic Surgery       Meds ordered this encounter  Medications  . traMADol (ULTRAM) 50 MG tablet    Sig: Take 1 tablet (50 mg total) by mouth every 8 (eight) hours as needed for up to 5 days.    Dispense:  15 tablet    Refill:  0    Order Specific Question:   Supervising Provider    Answer:   Neva Seat, JEFFREY R [[id number]]  . methocarbamol (ROBAXIN) 500 MG tablet    Sig: Take 1 tablet (500 mg total) by mouth 4 (four) times daily.    Dispense:  60 tablet    Refill:  0    Order Specific Question:   Supervising Provider    Answer:   Neva Seat, JEFFREY R [[id number]]   PLAN Given the extent of his pain and the mechanism of injury, concern for soft tissue damage - perhaps meniscus or MCL?  Refer to ortho for further work up Tramadol and robaxin for pain Written for one week out of work Patient encouraged to call clinic with any questions, comments, or concerns.  Janeece Agee, NP

## 2020-11-23 NOTE — Patient Instructions (Signed)
° ° ° °  If you have lab work done today you will be contacted with your lab results within the next 2 weeks.  If you have not heard from us then please contact us. The fastest way to get your results is to register for My Chart. ° ° °IF you received an x-ray today, you will receive an invoice from [facility]. Please contact [facility] at [phone] with questions or concerns regarding your invoice.  ° °IF you received labwork today, you will receive an invoice from [facility]. Please contact [facility] at [phone] with questions or concerns regarding your invoice.  ° °Our billing staff will not be able to assist you with questions regarding bills from these companies. ° °You will be contacted with the lab results as soon as they are available. The fastest way to get your results is to activate your My Chart account. Instructions are located on the last page of this paperwork. If you have not heard from us regarding the results in 2 weeks, please contact this office. °  ° ° ° °

## 2020-11-26 ENCOUNTER — Ambulatory Visit: Payer: Self-pay

## 2020-11-26 ENCOUNTER — Encounter: Payer: Self-pay | Admitting: Surgery

## 2020-11-26 ENCOUNTER — Ambulatory Visit (INDEPENDENT_AMBULATORY_CARE_PROVIDER_SITE_OTHER): Payer: 59 | Admitting: Surgery

## 2020-11-26 VITALS — BP 106/76 | HR 87 | Ht 75.0 in | Wt 167.8 lb

## 2020-11-26 DIAGNOSIS — M25361 Other instability, right knee: Secondary | ICD-10-CM

## 2020-11-26 DIAGNOSIS — M25561 Pain in right knee: Secondary | ICD-10-CM | POA: Diagnosis not present

## 2020-11-26 NOTE — Progress Notes (Signed)
Office Visit Note   Patient: Adam Wallace           Date of Birth: 1991-04-03           MRN: [id number] Visit Date: 11/26/2020              Requested by: Janeece Agee, NP [address] [place],  [place] [address] PCP: Patient, No Pcp Per   Assessment & Plan: Visit Diagnoses:  1. Acute pain of right knee   2. Knee instability, right     Plan: With patient's feeling of right knee mechanical symptoms instability I recommend getting an MRI to rule out tears of the ACL, MCL and medial meniscus.  I will have him follow-up with Dr. August Saucer after completion to discuss results and further treatment options.  Today I did put him in a knee immobilizer and he was given crutches.  Okay for him to weight-bear as tolerated in the immobilizer.  He will come out of the immobilizer to work on range of motion but nothing too aggressive.  Given a work note taken him out of work until he returns to review the study.  Can use over-the-counter ibuprofen as directed.  Ice off-and-on as needed.  Follow-Up Instructions: Return in about 2 weeks (around 12/10/2020) for with dr dean to review right knee mri and discuss treatment.   Orders:  Orders Placed This Encounter  Procedures  . XR KNEE 3 VIEW RIGHT  . MR Knee Right w/o contrast   No orders of the defined types were placed in this encounter.     Procedures: No procedures performed   Clinical Data: No additional findings.   Subjective: Chief Complaint  Patient presents with  . Right Knee - Pain    HPI 30 year old black male who is new patient to clinic comes in today with complaints of right knee pain, instability and mechanical symptoms.  States that knee pain started a week ago after he fell in his home.  States last Thursday he was walking that he was walking and slipped falling to the floor causing a forceful flexion and valgus stress of the right knee.  Had immediate swelling afterwards.  Since the injury he has been having feeling of his knee  locking and not able to get to full extension but also has problems bending the knee.  States that he feels like his knee is "unstable" and gives away.  States that he has had some problems with his knee off and on from previous injuries but nothing to this extent.  He did go to an urgent care.  He was not immobilized or given crutches.  He was taken out of work until he followed up with [place].    Review of Systems No current cardiac pulmonary GI GU issues  Objective: Vital Signs: BP 106/76   Pulse 87   Ht 6\' 3"  (1.905 m)   Wt 167 lb 12.8 oz (76.1 kg)   BMI 20.97 kg/m   Physical Exam Constitutional:      Appearance: Normal appearance.  HENT:     Head: Normocephalic.  Eyes:     Extraocular Movements: Extraocular movements intact.  Pulmonary:     Effort: No respiratory distress.  Musculoskeletal:     Comments: Gait is antalgic.  Negative logroll bilateral hips.  Right knee he does have some swelling with small effusion.  He is unable to fully extend his knee.  Knee flexion to about 90 degrees with pain.  Exquisitely tender over the  medial joint line and MCL.  Positive McMurray's test.  Question trace laxity with anterior drawer.  Knee valgus stress causes pain medially and there is some question of may be trace laxity.  Patient had a fair amount discomfort with this test.  Calf nontender.  Neurovascular intact.  Neurological:     Mental Status: He is alert and oriented to person, place, and time.  Psychiatric:        Mood and Affect: Mood normal.     Ortho Exam  Specialty Comments:  No specialty comments available.  Imaging: No results found.   PMFS History: There are no problems to display for this patient.  Past Medical History:  Diagnosis Date  . Asthma     Family History  Problem Relation Age of Onset  . Diabetes Other     History reviewed. No pertinent surgical history. Social History   Occupational History  . Not on file  Tobacco Use  . Smoking status:  Current Every Day Smoker    Types: Cigarettes  . Smokeless tobacco: Never Used  Substance and Sexual Activity  . Alcohol use: Yes    Alcohol/week: 1.0 standard drink    Types: 1 Cans of beer per week    Comment: social  . Drug use: No  . Sexual activity: Not on file

## 2020-11-27 ENCOUNTER — Telehealth: Payer: Self-pay | Admitting: Surgery

## 2020-11-27 NOTE — Telephone Encounter (Signed)
[employer] forms received. Sent to [employer] 

## 2020-12-01 ENCOUNTER — Ambulatory Visit
Admission: RE | Admit: 2020-12-01 | Discharge: 2020-12-01 | Disposition: A | Discharge status: Home / Self Care | Payer: 59 | Source: Ambulatory Visit | Attending: Surgery | Admitting: Surgery

## 2020-12-01 ENCOUNTER — Other Ambulatory Visit: Payer: Self-pay

## 2020-12-01 DIAGNOSIS — M25361 Other instability, right knee: Secondary | ICD-10-CM

## 2020-12-01 DIAGNOSIS — M25561 Pain in right knee: Secondary | ICD-10-CM

## 2020-12-02 ENCOUNTER — Telehealth: Payer: Self-pay | Admitting: Orthopedic Surgery

## 2020-12-02 NOTE — Telephone Encounter (Signed)
Received $25.00 cash,medical records release form and disability paperwork/ Forwarding to [employer] today 

## 2020-12-04 ENCOUNTER — Telehealth: Payer: Self-pay | Admitting: Orthopedic Surgery

## 2020-12-04 NOTE — Telephone Encounter (Signed)
Received $25.00 cash from patient's wife Eboni.  Forwarding to [employer]

## 2020-12-07 ENCOUNTER — Telehealth: Payer: Self-pay

## 2020-12-07 NOTE — Telephone Encounter (Signed)
Talked with patient and advised him to keep his appointment with Dr. August Saucer per Zonia Kief.  Patient voiced that he understands.

## 2020-12-10 ENCOUNTER — Ambulatory Visit (INDEPENDENT_AMBULATORY_CARE_PROVIDER_SITE_OTHER): Payer: 59 | Admitting: Orthopedic Surgery

## 2020-12-10 ENCOUNTER — Other Ambulatory Visit: Payer: Self-pay

## 2020-12-10 ENCOUNTER — Telehealth: Payer: Self-pay | Admitting: Orthopedic Surgery

## 2020-12-10 DIAGNOSIS — S83411A Sprain of medial collateral ligament of right knee, initial encounter: Secondary | ICD-10-CM

## 2020-12-10 NOTE — Telephone Encounter (Signed)
Patient called requesting a back to work note to employer with no restrictions to be sent to patient's mychart. Patient phone number is [phone].

## 2020-12-10 NOTE — Telephone Encounter (Signed)
Okay for note

## 2020-12-11 NOTE — Telephone Encounter (Signed)
You saw

## 2020-12-11 NOTE — Telephone Encounter (Signed)
Note made.  

## 2020-12-12 ENCOUNTER — Encounter: Payer: Self-pay | Admitting: Orthopedic Surgery

## 2020-12-12 NOTE — Progress Notes (Signed)
Office Visit Note   Patient: Adam Wallace           Date of Birth: November 10, 1990           MRN: [id number] Visit Date: 12/10/2020 Requested by: No referring provider defined for this encounter. PCP: Patient, No Pcp Per (Inactive)  Subjective: Chief Complaint  Patient presents with  . Right Knee - Pain    HPI: Adam Wallace is a 30 y.o. male who presents to the office complaining of right knee pain.  Patient injured his right knee on 11/26/2020 when he was playing with his pets and twisted his knee while he was running and socks.  He describes a valgus injury to his knee with swelling slightly at the time of injury.  He saw Zonia Kief, PA-C after his injury who was concerned for ACL/MCL injury and ordered MRI scan of the right knee.  He notes since that visit he has been full weightbearing in a knee immobilizer and his knee feels somewhat weaker and unstable but he has had no actual episodes of instability aside from the initial injury.  Denies any locking or mechanical symptoms of the knee.  He has no history of prior knee surgery.  Pain is improving and he rates it about 6/10 at this time.  His main hobby is playing basketball but he has not been back to play basketball since then.  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                ROS: All systems reviewed are negative as they relate to the chief complaint within the history of present illness.  Patient denies fevers or chills.  Assessment & Plan: Visit Diagnoses:  1. Sprain of medial collateral ligament of right knee, initial encounter     Plan: Patient is a 30 year old male who presents complaint of right knee pain.  He had valgus injury to his right knee and saw Zonia Kief who ordered an MRI scan.  He is here today to review scan.  He has grade 2 MCL sprain with nondisplaced subchondral fracture at the lateral tibial plateau.  No meniscus damage and ACL is intact.  He does  not have much in the way of lateral pain though he has slight lateral joint line tenderness on exam today.  Most of his symptoms are related to the MCL sprain.  There is no instability or laxity on examination today but he does have a lot of pain around the proximal aspect of the MCL which correlates with his MRI findings.  Plan to have patient discontinue knee immobilizer and ambulate full weightbearing in a hinged knee brace.  He may return to work as long as he wears the brace but if he feels his knee gives out on him or is concerned about reinjury, recommended he call the office and work note will be provided.  Patient agreed with this plan.  He will not return to playing pickup basketball at this time.  Follow-up in 4 weeks for clinical recheck. Scan reviewed with Dr. August Saucer  Follow-Up Instructions: No follow-ups on file.   Orders:  No orders of the defined types were placed in this encounter.  No orders of the defined types were placed in this encounter.     Procedures: No procedures performed   Clinical Data: No additional findings.  Objective: Vital Signs: There were no vitals taken for this visit.  Physical Exam:  Constitutional:  Patient appears well-developed HEENT:  Head: Normocephalic Eyes:EOM are normal Neck: Normal range of motion Cardiovascular: Normal rate Pulmonary/chest: Effort normal Neurologic: Patient is alert Skin: Skin is warm Psychiatric: Patient has normal mood and affect  Ortho Exam: Ortho exam demonstrates right knee with no effusion.  He has exquisite tenderness over the proximal aspect of the MCL.  No tenderness over the medial joint line.  Mild tenderness over the posterior aspect of the lateral joint line.  Negative Lachman exam.  No instability or laxity to varus/valgus stress at 0 and 30 degrees.  No laxity to anterior/posterior drawer.  He has some mild pain with stressing the MCL at 30 degrees of flexion.  No posterior lateral rotational instability.   Negative Homans' sign, no calf tenderness.  Able to perform straight leg raise.  0 degrees of extension and about 125 degrees of knee flexion.  Specialty Comments:  No specialty comments available.  Imaging: No results found.   PMFS History: There are no problems to display for this patient.  Past Medical History:  Diagnosis Date  . Asthma     Family History  Problem Relation Age of Onset  . Diabetes Other     No past surgical history on file. Social History   Occupational History  . Not on file  Tobacco Use  . Smoking status: Current Every Day Smoker    Types: Cigarettes  . Smokeless tobacco: Never Used  Substance and Sexual Activity  . Alcohol use: Yes    Alcohol/week: 1.0 standard drink    Types: 1 Cans of beer per week    Comment: social  . Drug use: No  . Sexual activity: Not on file

## 2021-01-07 ENCOUNTER — Ambulatory Visit (INDEPENDENT_AMBULATORY_CARE_PROVIDER_SITE_OTHER): Payer: 59 | Admitting: Orthopedic Surgery

## 2021-01-07 ENCOUNTER — Encounter: Payer: Self-pay | Admitting: Orthopedic Surgery

## 2021-01-07 ENCOUNTER — Other Ambulatory Visit: Payer: Self-pay

## 2021-01-07 DIAGNOSIS — S83411A Sprain of medial collateral ligament of right knee, initial encounter: Secondary | ICD-10-CM | POA: Diagnosis not present

## 2021-01-07 NOTE — Progress Notes (Signed)
Office Visit Note   Patient: Adam Wallace           Date of Birth: [birth year]           MRN: [id number] Visit Date: 01/07/2021 Requested by: No referring provider defined for this encounter. PCP: Patient, No Pcp Per (Inactive)  Subjective: Chief Complaint  Patient presents with  . Right Knee - Follow-up    HPI: Adam Wallace is a 30 y.o. male who presents to the office complaining of right knee pain.  Patient is returning following valgus injury to the right knee sustained on 11/26/2020.  He reports he is doing well and has much improved pain.  He has occasional pain when he sleeps and some stiffness in the morning that works itself out over the early hours of the morning.  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  He has been using a hinged knee brace when at work and denies any instability episodes.  He has not returned to playing basketball which is his main hobby..                ROS: All systems reviewed are negative as they relate to the chief complaint within the history of present illness.  Patient denies fevers or chills.  Assessment & Plan: Visit Diagnoses:  1. Sprain of medial collateral ligament of right knee, initial encounter     Plan: Patient is a 30 year old male who presents following right knee valgus injury on 11/26/2020.  He is here for follow-up visit.  Doing well after returning to work in full capacity using a hinged knee brace.  Very slight 1 mm cytocide difference in the laxity of the MCL on exam today compared with contralateral knee.  Nothing that should cause significant instability in the future.  He has had no instability events.  Has not returned to basketball yet.  Plan to pursue rehabilitation of the right knee to work on quadricep and hamstring muscle strengthening and then okay to ease back into playing basketball with his brace on in 3 weeks.  Patient  understands and agrees with plan.  Follow-up as needed if any problems.  Follow-Up Instructions: No follow-ups on file.   Orders:  No orders of the defined types were placed in this encounter.  No orders of the defined types were placed in this encounter.     Procedures: No procedures performed   Clinical Data: No additional findings.  Objective: Vital Signs: There were no vitals taken for this visit.  Physical Exam:  Constitutional: Patient appears well-developed HEENT:  Head: Normocephalic Eyes:EOM are normal Neck: Normal range of motion Cardiovascular: Normal rate Pulmonary/chest: Effort normal Neurologic: Patient is alert Skin: Skin is warm Psychiatric: Patient has normal mood and affect  Ortho Exam: Ortho exam demonstrates right knee without effusion.  No tenderness over the medial or lateral joint lines.  He does have some very mild soreness over the proximal attachment of the MCL but this is much improved compared with prior exam.  No significantly increased laxity to varus/valgus stress when the knee is at 0 or 30 degrees but he does have almost imperceptible 1 mm cytocide difference compared with the contralateral knee.  He has slight quad atrophy of the right quad compared to the left quad.  He is able to perform straight leg raise and has pretty decent strength of the right quad.  No laxity to anterior/posterior  drawer.  No posterior lateral rotational instability.  Negative Lachman exam.  No calf tenderness.  Negative Homans' sign.  Specialty Comments:  No specialty comments available.  Imaging: No results found.   PMFS History: There are no problems to display for this patient.  Past Medical History:  Diagnosis Date  . Asthma     Family History  Problem Relation Age of Onset  . Diabetes Other     No past surgical history on file. Social History   Occupational History  . Not on file  Tobacco Use  . Smoking status: Current Every Day Smoker     Types: Cigarettes  . Smokeless tobacco: Never Used  Substance and Sexual Activity  . Alcohol use: Yes    Alcohol/week: 1.0 standard drink    Types: 1 Cans of beer per week    Comment: social  . Drug use: No  . Sexual activity: Not on file

## 2022-01-19 ENCOUNTER — Ambulatory Visit (INDEPENDENT_AMBULATORY_CARE_PROVIDER_SITE_OTHER): Payer: 59 | Admitting: Adult Health

## 2022-01-19 ENCOUNTER — Encounter: Payer: Self-pay | Admitting: Adult Health

## 2022-01-19 VITALS — BP 130/82 | HR 76 | Temp 98.8°F | Ht 74.75 in | Wt 165.2 lb

## 2022-01-19 DIAGNOSIS — Z7689 Persons encountering health services in other specified circumstances: Secondary | ICD-10-CM | POA: Diagnosis not present

## 2022-01-19 DIAGNOSIS — J4520 Mild intermittent asthma, uncomplicated: Secondary | ICD-10-CM | POA: Diagnosis not present

## 2022-01-19 DIAGNOSIS — F32A Depression, unspecified: Secondary | ICD-10-CM | POA: Insufficient documentation

## 2022-01-19 DIAGNOSIS — J45909 Unspecified asthma, uncomplicated: Secondary | ICD-10-CM | POA: Insufficient documentation

## 2022-01-19 MED ORDER — ALBUTEROL SULFATE HFA 108 (90 BASE) MCG/ACT IN AERS: 1.0000 | INHALATION_SPRAY | Freq: Four times a day (QID) | RESPIRATORY_TRACT | 2 refills | Status: AC | PRN

## 2022-01-19 NOTE — Patient Instructions (Addendum)
It was great seeing you today  ? ?Please contact Dr. Renelda Mom  ?[facility]  ?Address: [address] [place], [place], [place] [address] ?Phone: [phone] ? ? ?Try using Prilosec for acid reflux, take this daily for about 2 weeks  ? ?Schedule a physical when you can  ?

## 2022-01-19 NOTE — Progress Notes (Signed)
? ? ?Patient presents to clinic today to establish care. He is a pleasant 31 year old male who  has a past medical history of Asthma. ? ? ?Acute Concerns: ?Establish Care  ? ?Chronic Issues: ?Asthma -childhood asthma.  Last had albuterol inhaler prescribed to him in 2015.  He does feel at times, and this has been recently where he will feel pressure in his chest with some mild shortness of breath and the feeling of not being able to take a full breath. ? ? ?Health Maintenance: ?Dental -- Does not do routine care ?Vision -- Does routine care - wears contacts  ?Immunizations -- Refuses tdap at this time  ?Colonoscopy --never had ?Diet: Eats a lot of fresh food.  ?Exercise: Stays active with his job and but does not exercise outside of work  ? ?Past Medical History:  ?Diagnosis Date  ? Asthma   ? ? ?No past surgical history on file. ? ?No current outpatient medications on file prior to visit.  ? ?No current facility-administered medications on file prior to visit.  ? ? ?No Known Allergies ? ?Family History  ?Problem Relation Age of Onset  ? Diabetes Other   ? ? ?Social History  ? ?Socioeconomic History  ? Marital status: Married  ?  Spouse name: Not on file  ? Number of children: Not on file  ? Years of education: Not on file  ? Highest education level: Not on file  ?Occupational History  ? Not on file  ?Tobacco Use  ? Smoking status: Every Day  ?  Types: Cigarettes  ? Smokeless tobacco: Never  ?Substance and Sexual Activity  ? Alcohol use: Yes  ?  Alcohol/week: 1.0 standard drink  ?  Types: 1 Cans of beer per week  ?  Comment: social  ? Drug use: No  ? Sexual activity: Not on file  ?Other Topics Concern  ? Not on file  ?Social History Narrative  ? Not on file  ? ?Social Determinants of Health  ? ?Financial Resource Strain: Not on file  ?Food Insecurity: Not on file  ?Transportation Needs: Not on file  ?Physical Activity: Not on file  ?Stress: Not on file  ?Social Connections: Not on file  ?Intimate Partner Violence:  Not on file  ? ? ?Review of Systems  ?Constitutional: Negative.   ?HENT: Negative.    ?Eyes: Negative.   ?Respiratory: Negative.    ?Cardiovascular: Negative.   ?Gastrointestinal: Negative.   ?Genitourinary: Negative.   ?Musculoskeletal: Negative.   ?Skin: Negative.   ?Neurological: Negative.   ?Endo/Heme/Allergies: Negative.   ?Psychiatric/Behavioral: Negative.    ? ?BP 130/82 (BP Location: Left Arm, Patient Position: Sitting, Cuff Size: Normal)   Pulse 76   Temp 98.8 ?F (37.1 ?C) (Oral)   Ht 6' 2.75" (1.899 m)   Wt 165 lb 3.2 oz (74.9 kg)   SpO2 97%   BMI 20.79 kg/m?  ? ?Physical Exam ?Vitals and nursing note reviewed.  ?Constitutional:   ?   General: He is not in acute distress. ?   Appearance: Normal appearance. He is well-developed and normal weight.  ?HENT:  ?   Head: Normocephalic and atraumatic.  ?   Right Ear: Tympanic membrane, ear canal and external ear normal. There is no impacted cerumen.  ?   Left Ear: Tympanic membrane, ear canal and external ear normal. There is no impacted cerumen.  ?   Nose: Nose normal. No congestion or rhinorrhea.  ?   Mouth/Throat:  ?   Mouth:  Mucous membranes are moist.  ?   Pharynx: Oropharynx is clear. No oropharyngeal exudate or posterior oropharyngeal erythema.  ?Eyes:  ?   General:     ?   Right eye: No discharge.     ?   Left eye: No discharge.  ?   Extraocular Movements: Extraocular movements intact.  ?   Conjunctiva/sclera: Conjunctivae normal.  ?   Pupils: Pupils are equal, round, and reactive to light.  ?Neck:  ?   Vascular: No carotid bruit.  ?   Trachea: No tracheal deviation.  ?Cardiovascular:  ?   Rate and Rhythm: Normal rate and regular rhythm.  ?   Pulses: Normal pulses.  ?   Heart sounds: Normal heart sounds. No murmur heard. ?  No friction rub. No gallop.  ?Pulmonary:  ?   Effort: Pulmonary effort is normal. No respiratory distress.  ?   Breath sounds: Normal breath sounds. No stridor. No wheezing, rhonchi or rales.  ?Chest:  ?   Chest wall: No  tenderness.  ?Abdominal:  ?   General: Bowel sounds are normal. There is no distension.  ?   Palpations: Abdomen is soft. There is no mass.  ?   Tenderness: There is no abdominal tenderness. There is no right CVA tenderness, left CVA tenderness, guarding or rebound.  ?   Hernia: No hernia is present.  ?Musculoskeletal:     ?   General: No swelling, tenderness, deformity or signs of injury. Normal range of motion.  ?   Right lower leg: No edema.  ?   Left lower leg: No edema.  ?Lymphadenopathy:  ?   Cervical: No cervical adenopathy.  ?Skin: ?   General: Skin is warm and dry.  ?   Capillary Refill: Capillary refill takes less than 2 seconds.  ?   Coloration: Skin is not jaundiced or pale.  ?   Findings: No bruising, erythema, lesion or rash.  ?Neurological:  ?   General: No focal deficit present.  ?   Mental Status: He is alert and oriented to person, place, and time.  ?   Cranial Nerves: No cranial nerve deficit.  ?   Sensory: No sensory deficit.  ?   Motor: No weakness.  ?   Coordination: Coordination normal.  ?   Gait: Gait normal.  ?   Deep Tendon Reflexes: Reflexes normal.  ?Psychiatric:     ?   Mood and Affect: Mood normal.     ?   Behavior: Behavior normal.     ?   Thought Content: Thought content normal.     ?   Judgment: Judgment normal.  ? ? ?Assessment/Plan: ?1. Encounter to establish care ?-Encouraged heart healthy diet and exercise outside of work.  We will have him follow-up at his convenience for her CPE.  He can contact me through MyChart with any questions or concerns. ? ?2. Mild intermittent asthma without complication ? ?- albuterol (VENTOLIN HFA) 108 (90 Base) MCG/ACT inhaler; Inhale 1-2 puffs into the lungs every 6 (six) hours as needed for wheezing.  Dispense: 1 each; Refill: 2 ? ? ?Shirline Frees, NP ? ? ? ?

## 2022-02-16 ENCOUNTER — Encounter: Payer: 59 | Admitting: Adult Health

## 2022-02-17 ENCOUNTER — Encounter: Payer: Self-pay | Admitting: Adult Health

## 2022-02-17 ENCOUNTER — Ambulatory Visit (INDEPENDENT_AMBULATORY_CARE_PROVIDER_SITE_OTHER): Payer: 59 | Admitting: Adult Health

## 2022-02-17 VITALS — BP 102/80 | HR 90 | Temp 99.0°F | Ht 74.5 in | Wt 165.0 lb

## 2022-02-17 DIAGNOSIS — J4520 Mild intermittent asthma, uncomplicated: Secondary | ICD-10-CM | POA: Diagnosis not present

## 2022-02-17 DIAGNOSIS — Z0000 Encounter for general adult medical examination without abnormal findings: Secondary | ICD-10-CM

## 2022-02-17 DIAGNOSIS — Z1159 Encounter for screening for other viral diseases: Secondary | ICD-10-CM | POA: Diagnosis not present

## 2022-02-17 NOTE — Patient Instructions (Addendum)
It was great seeing you today   We will follow up with you regarding your lab work   Please let me know if you need anything     [facility] - Dr. Dario [address] Address: [address], [place], [place] [address] Phone: [phone]

## 2022-02-17 NOTE — Progress Notes (Signed)
Subjective:    Patient ID: Adam Wallace, male    DOB: 12-31-1990, 31 y.o.   MRN: [id number]  HPI Patient presents for yearly preventative medicine examination. He is a pleasant 31 year old male who  has a past medical history of Asthma.  Asthma - mild and intermittent. Uses rescue inhaler as needed  All immunizations and health maintenance protocols were reviewed with the patient and needed orders were placed. Refuses Tdap.   Appropriate screening laboratory values were ordered for the patient including screening of hyperlipidemia, renal function and hepatic function.  Medication reconciliation,  past medical history, social history, problem list and allergies were reviewed in detail with the patient  Goals were established with regard to weight loss, exercise, and  diet in compliance with medications. He has been trying to eat healthier and is staying active  Wt Readings from Last 3 Encounters:  02/17/22 165 lb (74.8 kg)  01/19/22 165 lb 3.2 oz (74.9 kg)  11/26/20 167 lb 12.8 oz (76.1 kg)   He has no acute issues.    Review of Systems  Constitutional: Negative.   HENT: Negative.    Eyes: Negative.   Respiratory: Negative.    Cardiovascular: Negative.   Gastrointestinal: Negative.   Endocrine: Negative.   Genitourinary: Negative.   Musculoskeletal: Negative.   Skin: Negative.   Allergic/Immunologic: Negative.   Neurological: Negative.   Hematological: Negative.   Psychiatric/Behavioral: Negative.    All other systems reviewed and are negative.  Past Medical History:  Diagnosis Date   Asthma     Social History   Socioeconomic History   Marital status: Married    Spouse name: Not on file   Number of children: Not on file   Years of education: some colleg   Highest education level: Not asked  Occupational History   Not on file  Tobacco Use   Smoking status: Former    Types: Cigarettes   Smokeless tobacco: Never  Substance and Sexual Activity   Alcohol use:  Yes    Alcohol/week: 1.0 standard drink of alcohol    Types: 1 Cans of beer per week    Comment: social   Drug use: No   Sexual activity: Yes  Other Topics Concern   Not on file  Social History Narrative   Not on file   Social Determinants of Health   Financial Resource Strain: Not on file  Food Insecurity: Not on file  Transportation Needs: Not on file  Physical Activity: Inactive (01/19/2022)   Exercise Vital Sign    Days of Exercise per Week: 0 days    Minutes of Exercise per Session: 0 min  Stress: Not on file  Social Connections: Not on file  Intimate Partner Violence: Not on file    No past surgical history on file.  Family History  Problem Relation Age of Onset   Diabetes Mother    Diabetes Maternal Grandfather    Heart attack Maternal Grandfather    Diabetes Other     No Known Allergies  Current Outpatient Medications on File Prior to Visit  Medication Sig Dispense Refill   albuterol (VENTOLIN HFA) 108 (90 Base) MCG/ACT inhaler Inhale 1-2 puffs into the lungs every 6 (six) hours as needed for wheezing. 1 each 2   No current facility-administered medications on file prior to visit.    There were no vitals taken for this visit.      Objective:   Physical Exam Vitals and nursing note reviewed.  Constitutional:  General: He is not in acute distress.    Appearance: Normal appearance. He is well-developed and normal weight.  HENT:     Head: Normocephalic and atraumatic.     Right Ear: Tympanic membrane, ear canal and external ear normal. There is no impacted cerumen.     Left Ear: Tympanic membrane, ear canal and external ear normal. There is no impacted cerumen.     Nose: Nose normal. No congestion or rhinorrhea.     Mouth/Throat:     Mouth: Mucous membranes are moist.     Pharynx: Oropharynx is clear. No oropharyngeal exudate or posterior oropharyngeal erythema.  Eyes:     General:        Right eye: No discharge.        Left eye: No discharge.      Extraocular Movements: Extraocular movements intact.     Conjunctiva/sclera: Conjunctivae normal.     Pupils: Pupils are equal, round, and reactive to light.  Neck:     Vascular: No carotid bruit.     Trachea: No tracheal deviation.  Cardiovascular:     Rate and Rhythm: Normal rate and regular rhythm.     Pulses: Normal pulses.     Heart sounds: Normal heart sounds. No murmur heard.    No friction rub. No gallop.  Pulmonary:     Effort: Pulmonary effort is normal. No respiratory distress.     Breath sounds: Normal breath sounds. No stridor. No wheezing, rhonchi or rales.  Chest:     Chest wall: No tenderness.  Abdominal:     General: Bowel sounds are normal. There is no distension.     Palpations: Abdomen is soft. There is no mass.     Tenderness: There is no abdominal tenderness. There is no right CVA tenderness, left CVA tenderness, guarding or rebound.     Hernia: No hernia is present.  Musculoskeletal:        General: No swelling, tenderness, deformity or signs of injury. Normal range of motion.     Right lower leg: No edema.     Left lower leg: No edema.  Lymphadenopathy:     Cervical: No cervical adenopathy.  Skin:    General: Skin is warm and dry.     Capillary Refill: Capillary refill takes less than 2 seconds.     Coloration: Skin is not jaundiced or pale.     Findings: No bruising, erythema, lesion or rash.  Neurological:     General: No focal deficit present.     Mental Status: He is alert and oriented to person, place, and time.     Cranial Nerves: No cranial nerve deficit.     Sensory: No sensory deficit.     Motor: No weakness.     Coordination: Coordination normal.     Gait: Gait normal.     Deep Tendon Reflexes: Reflexes normal.  Psychiatric:        Mood and Affect: Mood normal.        Behavior: Behavior normal.        Thought Content: Thought content normal.        Judgment: Judgment normal.       Assessment & Plan:   1. Routine general medical  examination at a health care facility - Benign exam - Follow up in one year or sooner if needed - CBC with Differential/Platelet; Future - Comprehensive metabolic panel; Future - Lipid panel; Future - TSH; Future - Hemoglobin A1c; Future  2. Mild intermittent  asthma without complication - Use rescue inhaler as needed  3. Need for hepatitis C screening test  - Hep C Antibody; Future  Shirline Frees, NP

## 2022-02-18 LAB — LIPID PANEL
Cholesterol: 236 mg/dL — ABNORMAL HIGH (ref 0–200)
HDL: 87.5 mg/dL (ref >39.00)
LDL Cholesterol: 137 mg/dL — ABNORMAL HIGH (ref 0–99)
NonHDL: 148.86
Total CHOL/HDL Ratio: 3
Triglycerides: 57 mg/dL (ref 0.0–149.0)
VLDL: 11.4 mg/dL (ref 0.0–40.0)

## 2022-02-18 LAB — CBC WITH DIFFERENTIAL/PLATELET
Basophils Absolute: 0 10*3/uL (ref 0.0–0.1)
Basophils Relative: 0.8 % (ref 0.0–3.0)
Eosinophils Absolute: 0.1 10*3/uL (ref 0.0–0.7)
Eosinophils Relative: 2.2 % (ref 0.0–5.0)
HCT: 43.3 % (ref 39.0–52.0)
Hemoglobin: 14.7 g/dL (ref 13.0–17.0)
Lymphocytes Relative: 31.7 % (ref 12.0–46.0)
Lymphs Abs: 1.8 10*3/uL (ref 0.7–4.0)
MCHC: 34 g/dL (ref 30.0–36.0)
MCV: 90.6 fl (ref 78.0–100.0)
Monocytes Absolute: 0.5 10*3/uL (ref 0.1–1.0)
Monocytes Relative: 9 % (ref 3.0–12.0)
Neutro Abs: 3.3 10*3/uL (ref 1.4–7.7)
Neutrophils Relative %: 56.3 % (ref 43.0–77.0)
Platelets: 219 10*3/uL (ref 150.0–400.0)
RBC: 4.78 Mil/uL (ref 4.22–5.81)
RDW: 13 % (ref 11.5–15.5)
WBC: 5.8 10*3/uL (ref 4.0–10.5)

## 2022-02-18 LAB — COMPREHENSIVE METABOLIC PANEL
ALT: 14 U/L (ref 0–53)
AST: 20 U/L (ref 0–37)
Albumin: 4.6 g/dL (ref 3.5–5.2)
Alkaline Phosphatase: 54 U/L (ref 39–117)
BUN: 12 mg/dL (ref 6–23)
CO2: 27 mEq/L (ref 19–32)
Calcium: 9.6 mg/dL (ref 8.4–10.5)
Chloride: 102 mEq/L (ref 96–112)
Creatinine, Ser: 1.26 mg/dL (ref 0.40–1.50)
GFR: 76.65 mL/min (ref >60.00)
Glucose, Bld: 82 mg/dL (ref 70–99)
Potassium: 4 mEq/L (ref 3.5–5.1)
Sodium: 138 mEq/L (ref 135–145)
Total Bilirubin: 1 mg/dL (ref 0.2–1.2)
Total Protein: 7.8 g/dL (ref 6.0–8.3)

## 2022-02-18 LAB — TSH: TSH: 1.26 u[IU]/mL (ref 0.35–5.50)

## 2022-02-18 LAB — HEMOGLOBIN A1C: Hgb A1c MFr Bld: 5.5 % (ref 4.6–6.5)

## 2022-02-18 LAB — HEPATITIS C ANTIBODY
Hepatitis C Ab: NONREACTIVE
SIGNAL TO CUT-OFF: 0.07 (ref <1.00)

## 2022-07-21 IMAGING — MR MR KNEE*R* W/O CM
7 series · 40 of 40 positions shown · non-contrast
Comparison: X-ray 11/26/2020

CLINICAL DATA: Medial right knee pain and instability after fall 2
weeks ago

EXAM:
MRI OF THE RIGHT KNEE WITHOUT CONTRAST
TECHNIQUE: Multiplanar, multisequence MR imaging of the knee was performed. No
intravenous contrast was administered.

[Series 6: T2 fat-sat · axial · right · 4.0mm · 0.62mm/px · z∈[-26,+136]mm · 7 of 38 slices shown (1 of 3)]
[im 1/38]
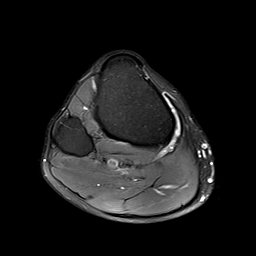
[im 7/38]
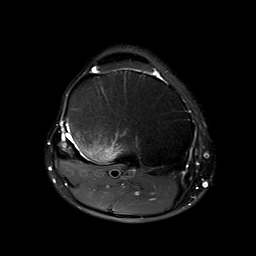
[im 13/38]
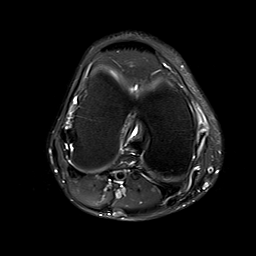
[im 19/38]
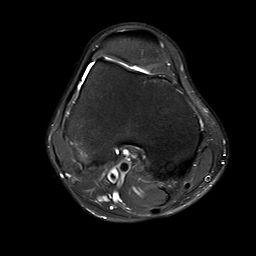
[im 25/38]
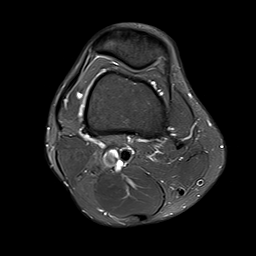
[im 31/38]
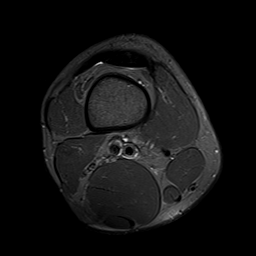
[im 38/38]
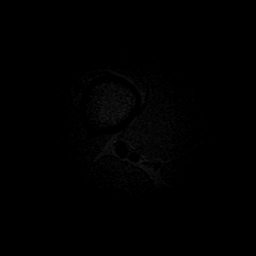

[Series 9: PD fat-sat · sagittal · right · 3.0mm · 0.50mm/px · 6 of 29 slices shown (1 of 2)]
[im 1/29]
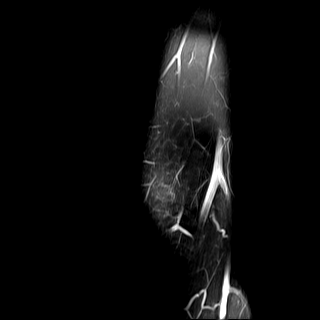
[im 6/29]
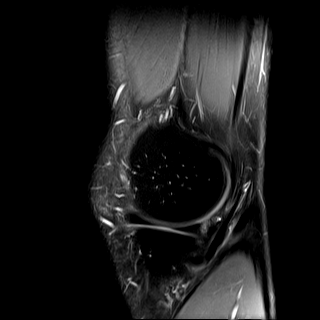
[im 12/29]
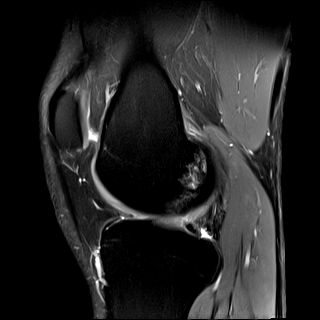
[im 17/29]
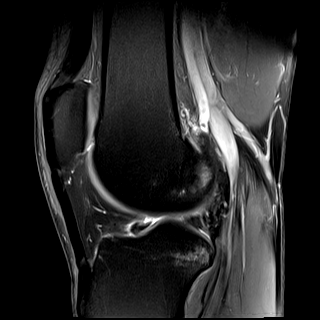
[im 23/29]
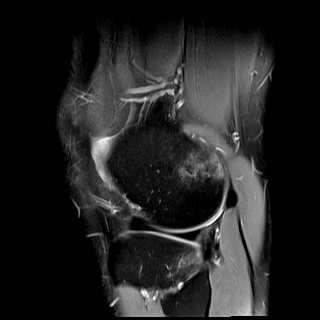
[im 29/29]
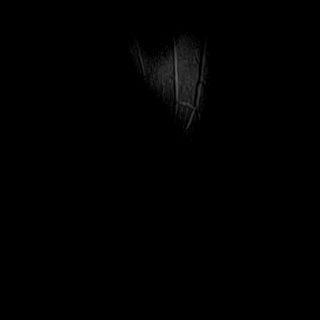

[Series 10: T1 · coronal · right · 4.0mm · 0.66mm/px · 5 of 27 slices shown]
[im 1/27]
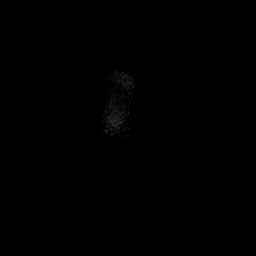
[im 7/27]
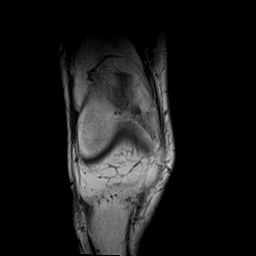
[im 14/27]
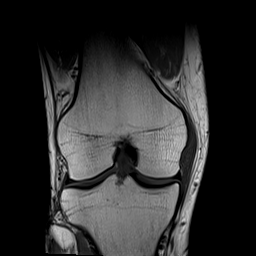
[im 20/27]
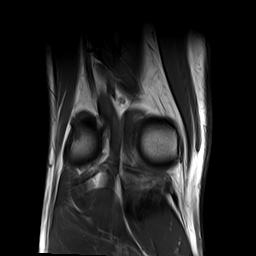
[im 27/27]
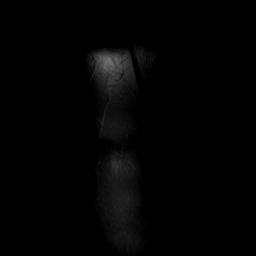

[Series 11: PD fat-sat · coronal · right · 3.0mm · 0.66mm/px · 7 of 33 slices shown (2 of 2)]
[im 1/33]
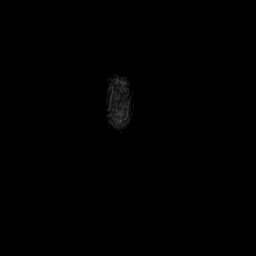
[im 6/33]
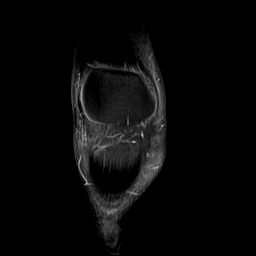
[im 11/33]
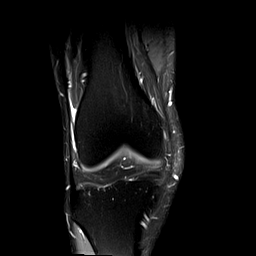
[im 17/33]
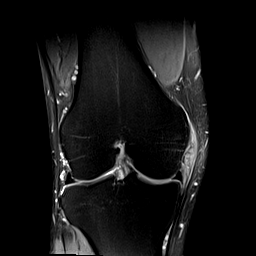
[im 22/33]
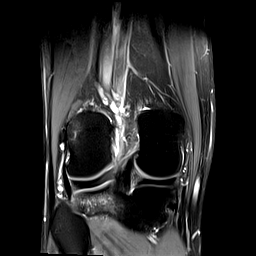
[im 27/33]
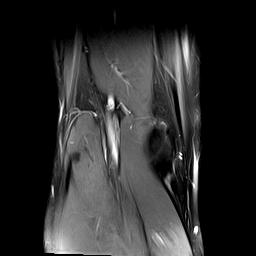
[im 33/33]
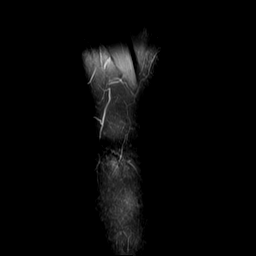

[Series 12: T2 fat-sat · sagittal · right · 3.0mm · 0.50mm/px · 6 of 28 slices shown (2 of 3)]
[im 1/28]
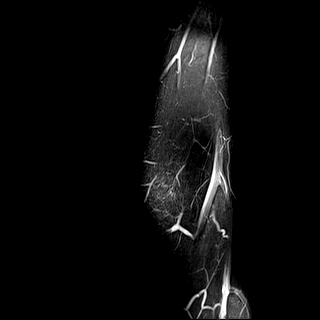
[im 6/28]
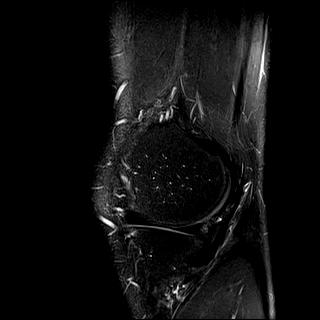
[im 11/28]
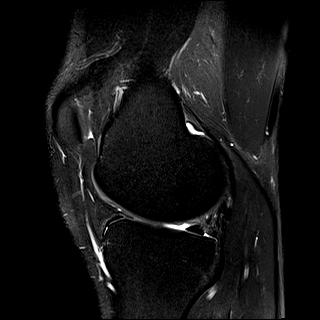
[im 17/28]
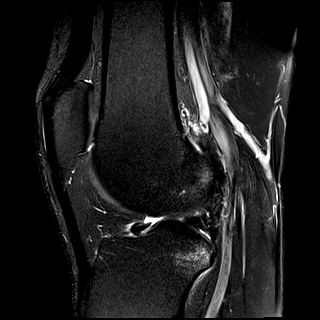
[im 22/28]
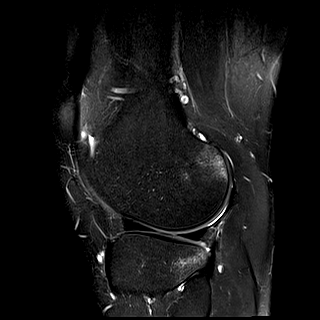
[im 28/28]
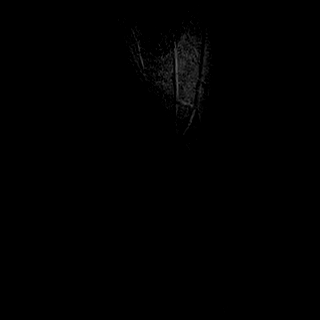

[Series 13: PD · oblique · right · 1.5mm · 0.44mm/px · 4 of 21 slices shown]
[im 1/21]
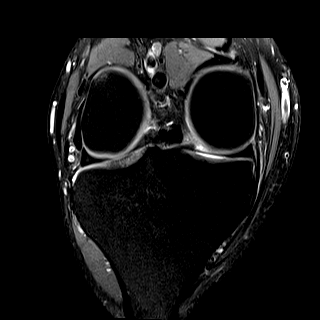
[im 7/21]
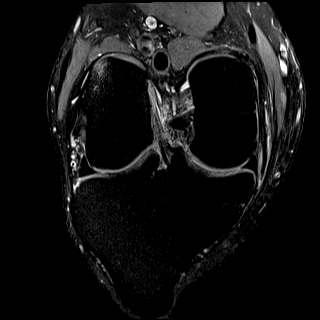
[im 14/21]
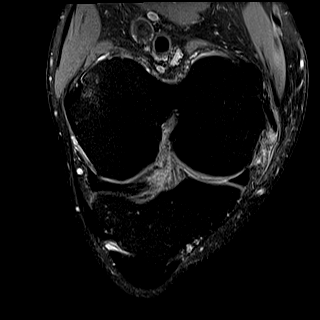
[im 21/21]
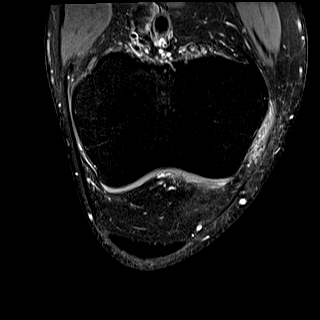

[Series 14: T2 fat-sat · coronal · right · 4.0mm · 0.66mm/px · 5 of 27 slices shown (3 of 3)]
[im 1/27]
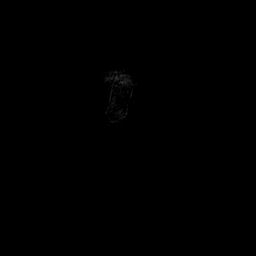
[im 7/27]
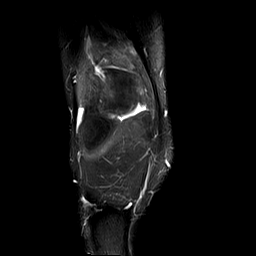
[im 14/27]
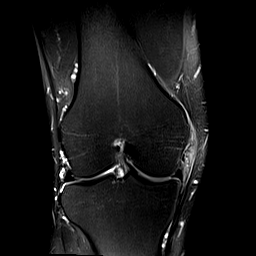
[im 20/27]
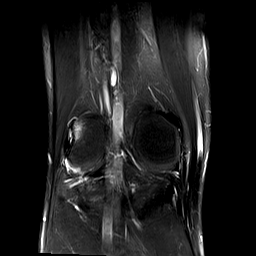
[im 27/27]
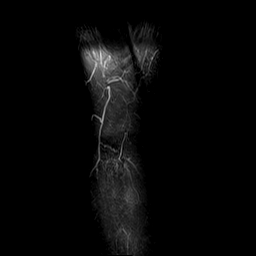

[40 of 40 positions shown; findings below may reference images not displayed]

FINDINGS: MENISCI

Medial meniscus:  Intact.

Lateral meniscus:  Intact.

LIGAMENTS

Cruciates: ACL intact with 6 x 3 mm intrasubstance ganglion cyst.
Intact PCL.

Collaterals: Partial thickness tear of the proximal MCL with
associated periligamentous edema (series 6, images 23-25). Lateral
collateral ligament complex intact.

CARTILAGE

Patellofemoral:  No chondral defect.

Medial:  No chondral defect.

Lateral:  No chondral defect.

Joint:  No joint effusion.  Unremarkable fat pads.

Popliteal Fossa:  No Baker cyst. Intact popliteus tendon.

Extensor Mechanism: Intact quadriceps tendon and patellar tendon.
Mild distal quadriceps tendinosis.

Bones: Subchondral fracture within the posterior aspect of the
lateral tibial plateau with surrounding bone marrow edema (series
10, image 10). Small bone contusion at the posterior aspect of the
lateral femoral condyle (series 12, image 24). Remaining osseous
structures are within normal limits. No suspicious bone lesion.

Other: No soft tissue edema or fluid collection. Normal muscle bulk
and signal intensity.
IMPRESSION: 1. Grade 2 MCL sprain.
2. Bone contusion at the posterior aspect of the lateral tibial
plateau with associated nondisplaced subchondral fracture line.
3. Small bone contusion at the posterior aspect of the lateral
femoral condyle.
4. Mild distal quadriceps tendinosis.
5. Small intrasubstance ganglion cyst within the ACL.
6. Intact menisci.

## 2023-02-23 ENCOUNTER — Encounter: Payer: 59 | Admitting: Adult Health

## 2023-07-10 ENCOUNTER — Ambulatory Visit: Payer: Managed Care, Other (non HMO) | Admitting: Physician Assistant

## 2023-07-10 ENCOUNTER — Encounter: Payer: Self-pay | Admitting: Physician Assistant

## 2023-07-10 ENCOUNTER — Other Ambulatory Visit (INDEPENDENT_AMBULATORY_CARE_PROVIDER_SITE_OTHER): Payer: Self-pay

## 2023-07-10 VITALS — Ht 75.0 in | Wt 165.0 lb

## 2023-07-10 DIAGNOSIS — M24562 Contracture, left knee: Secondary | ICD-10-CM

## 2023-07-10 DIAGNOSIS — M25562 Pain in left knee: Secondary | ICD-10-CM

## 2023-07-10 NOTE — Progress Notes (Signed)
Office Visit Note   Patient: Adam Wallace           Date of Birth: June 01, 1991           MRN: [id number] Visit Date: 07/10/2023              Requested by: Shirline Frees, NP [address] [place],  [place] [address] PCP: Shirline Frees, NP   Assessment & Plan: Visit Diagnoses:  1. Acute pain of left knee     Plan: Patient is a 32 year old gentleman who is a patient of Dr. Diamantina Providence.  In 10/25/2020 he had a twisting injury to his right knee.  He had an MCL tear.  States that yesterday he was chasing his dog a similar thing happen.  He does not have an effusion today or any significant swelling however he is hesitant to bend his knee at all.  All of his pain is over the medial side he is neurovascularly intact and has good quadricep and patella tendon function.  No lateral symptoms.  Does have pain with valgus stress.  Has a little increased translation as well.  Will put him in a knee brace on crutches get a stat MRI and have him follow-up with Dr. August Saucer.  Will give him a note out of work until MRI is reviewed.  He should follow-up with Dr. August Saucer once the MRI is completed.  Concerns for ACL MCL and meniscus injuries  Follow-Up Instructions: With Dr. August Saucer after MRI  Orders:  No orders of the defined types were placed in this encounter.  No orders of the defined types were placed in this encounter.     Procedures: No procedures performed   Clinical Data: No additional findings.   Subjective: No chief complaint on file.   HPI Patient is a pleasant 32 year old gentleman who was playing with his dog yesterday.  Took off running twisted his left knee.  He is a [employer] and he is having enough pain that he has trouble working and walking has had a similar incident happened on the right side  Review of Systems  All other systems reviewed and are negative.    Objective: Vital Signs: Ht 6\' 3"  (1.905 m)   Wt 165 lb (74.8 kg)   BMI 20.62 kg/m   Physical  Exam Constitutional:      Appearance: Normal appearance.  Pulmonary:     Effort: Pulmonary effort is normal.  Skin:    General: Skin is warm and dry.  Neurological:     Mental Status: He is alert.  Psychiatric:        Mood and Affect: Mood normal.        Behavior: Behavior normal.     Ortho Exam Examination of his left knee has no effusion no erythema quadriceps mechanism and patellar tendon mechanism are intact.  He is extremely painful to try and bend his knee.  Distal pulses are intact.  Compartments are soft and nontender no effusion he does have extreme pain over the medial compartment of his knee not as much over the lateral some increased translation but he does have quite a bit of guarding Specialty Comments:  No specialty comments available.  Imaging: No results found.   PMFS History: Patient Active Problem List   Diagnosis Date Noted   Pain in left knee 07/10/2023   Depression 01/19/2022   Asthma 01/19/2022   Past Medical History:  Diagnosis Date   Asthma     Family History  Problem  Relation Age of Onset   Diabetes Mother    Diabetes Maternal Grandfather    Heart attack Maternal Grandfather    Diabetes Other     History reviewed. No pertinent surgical history. Social History   Occupational History   Not on file  Tobacco Use   Smoking status: Former    Types: Cigarettes   Smokeless tobacco: Never  Substance and Sexual Activity   Alcohol use: Yes    Alcohol/week: 1.0 standard drink of alcohol    Types: 1 Cans of beer per week    Comment: social   Drug use: No   Sexual activity: Yes

## 2023-07-10 NOTE — Addendum Note (Signed)
Addended by: Michaele Offer on: 07/10/2023 02:16 PM   Modules accepted: Orders

## 2023-07-11 ENCOUNTER — Telehealth: Payer: Self-pay | Admitting: Physician Assistant

## 2023-07-11 NOTE — Telephone Encounter (Signed)
Patient was given a letter for light duties at work

## 2023-07-11 NOTE — Telephone Encounter (Signed)
Pt called to request a doctors note stating he can be on light restrictions at work. Pt stated instead of taking him completely out of work, there are other things he can do at work that is light duty. Pt will return to office to pick up the note when ready

## 2023-07-15 ENCOUNTER — Other Ambulatory Visit: Payer: Managed Care, Other (non HMO)

## 2023-07-17 ENCOUNTER — Ambulatory Visit: Payer: 59 | Admitting: Physician Assistant

## 2023-07-29 ENCOUNTER — Other Ambulatory Visit: Payer: Managed Care, Other (non HMO)

## 2024-07-08 ENCOUNTER — Encounter: Payer: Self-pay | Admitting: Radiology
